# Patient Record
Sex: Male | Born: 1950 | Race: White | Hispanic: No | Smoking: Never smoker
Health system: Southern US, Community
[De-identification: ages and names within clinical notes are randomized; demographics above are authoritative.]

## PROBLEM LIST (undated history)

## (undated) DIAGNOSIS — R0600 Dyspnea, unspecified: Secondary | ICD-10-CM

## (undated) DIAGNOSIS — F329 Major depressive disorder, single episode, unspecified: Secondary | ICD-10-CM

## (undated) DIAGNOSIS — F32A Depression, unspecified: Secondary | ICD-10-CM

## (undated) DIAGNOSIS — I639 Cerebral infarction, unspecified: Secondary | ICD-10-CM

## (undated) DIAGNOSIS — F4310 Post-traumatic stress disorder, unspecified: Secondary | ICD-10-CM

## (undated) DIAGNOSIS — F410 Panic disorder [episodic paroxysmal anxiety] without agoraphobia: Secondary | ICD-10-CM

## (undated) DIAGNOSIS — F419 Anxiety disorder, unspecified: Secondary | ICD-10-CM

## (undated) DIAGNOSIS — S069X9A Unspecified intracranial injury with loss of consciousness of unspecified duration, initial encounter: Secondary | ICD-10-CM

## (undated) DIAGNOSIS — M1990 Unspecified osteoarthritis, unspecified site: Secondary | ICD-10-CM

## (undated) DIAGNOSIS — I10 Essential (primary) hypertension: Secondary | ICD-10-CM

## (undated) DIAGNOSIS — I82409 Acute embolism and thrombosis of unspecified deep veins of unspecified lower extremity: Secondary | ICD-10-CM

## (undated) DIAGNOSIS — K219 Gastro-esophageal reflux disease without esophagitis: Secondary | ICD-10-CM

## (undated) DIAGNOSIS — Z87442 Personal history of urinary calculi: Secondary | ICD-10-CM

## (undated) HISTORY — PX: APPENDECTOMY: SHX54

## (undated) HISTORY — PX: MANDIBLE FRACTURE SURGERY: SHX706

## (undated) HISTORY — PX: CYSTOSCOPY: SUR368

## (undated) HISTORY — DX: Panic disorder (episodic paroxysmal anxiety): F41.0

## (undated) HISTORY — PX: ANKLE SURGERY: SHX546

## (undated) HISTORY — DX: Cerebral infarction, unspecified: I63.9

## (undated) HISTORY — PX: SHOULDER ARTHROSCOPY: SHX128

## (undated) HISTORY — PX: KNEE ARTHROSCOPY: SUR90

## (undated) HISTORY — PX: COLON RESECTION: SHX5231

## (undated) HISTORY — DX: Essential (primary) hypertension: I10

---

## 2002-05-07 ENCOUNTER — Emergency Department (HOSPITAL_COMMUNITY): Admission: EM | Admit: 2002-05-07 | Discharge: 2002-05-07 | Payer: Self-pay | Admitting: *Deleted

## 2002-07-25 ENCOUNTER — Encounter: Payer: Self-pay | Admitting: Family Medicine

## 2002-07-25 ENCOUNTER — Ambulatory Visit (HOSPITAL_COMMUNITY): Admission: RE | Admit: 2002-07-25 | Discharge: 2002-07-25 | Payer: Self-pay | Admitting: Family Medicine

## 2003-09-27 ENCOUNTER — Ambulatory Visit (HOSPITAL_COMMUNITY): Admission: RE | Admit: 2003-09-27 | Discharge: 2003-09-27 | Payer: Self-pay | Admitting: Family Medicine

## 2003-10-31 ENCOUNTER — Encounter: Admission: RE | Admit: 2003-10-31 | Discharge: 2003-10-31 | Payer: Self-pay | Admitting: Neurosurgery

## 2003-11-13 ENCOUNTER — Encounter: Admission: RE | Admit: 2003-11-13 | Discharge: 2003-11-13 | Payer: Self-pay | Admitting: Neurosurgery

## 2003-11-27 ENCOUNTER — Encounter: Admission: RE | Admit: 2003-11-27 | Discharge: 2003-11-27 | Payer: Self-pay | Admitting: Neurosurgery

## 2004-04-09 ENCOUNTER — Ambulatory Visit (HOSPITAL_COMMUNITY): Admission: RE | Admit: 2004-04-09 | Discharge: 2004-04-09 | Payer: Self-pay | Admitting: Family Medicine

## 2004-11-30 ENCOUNTER — Emergency Department (HOSPITAL_COMMUNITY): Admission: EM | Admit: 2004-11-30 | Discharge: 2004-11-30 | Payer: Self-pay | Admitting: Emergency Medicine

## 2005-02-11 ENCOUNTER — Emergency Department (HOSPITAL_COMMUNITY): Admission: EM | Admit: 2005-02-11 | Discharge: 2005-02-11 | Payer: Self-pay | Admitting: Emergency Medicine

## 2005-07-28 ENCOUNTER — Encounter: Admission: RE | Admit: 2005-07-28 | Discharge: 2005-07-28 | Payer: Self-pay | Admitting: Sports Medicine

## 2005-10-09 ENCOUNTER — Encounter (HOSPITAL_COMMUNITY): Admission: RE | Admit: 2005-10-09 | Discharge: 2005-10-30 | Payer: Self-pay | Admitting: Orthopedic Surgery

## 2005-11-03 HISTORY — PX: CERVICAL DISC SURGERY: SHX588

## 2005-11-03 HISTORY — PX: COLONOSCOPY: SHX174

## 2005-11-04 ENCOUNTER — Encounter: Admission: RE | Admit: 2005-11-04 | Discharge: 2005-11-04 | Payer: Self-pay | Admitting: Orthopedic Surgery

## 2006-01-07 ENCOUNTER — Ambulatory Visit (HOSPITAL_COMMUNITY): Admission: RE | Admit: 2006-01-07 | Discharge: 2006-01-07 | Payer: Self-pay | Admitting: Internal Medicine

## 2006-01-07 ENCOUNTER — Ambulatory Visit: Payer: Self-pay | Admitting: Internal Medicine

## 2006-07-14 ENCOUNTER — Ambulatory Visit (HOSPITAL_COMMUNITY): Admission: RE | Admit: 2006-07-14 | Discharge: 2006-07-15 | Payer: Self-pay | Admitting: Neurosurgery

## 2008-01-13 ENCOUNTER — Encounter: Admission: RE | Admit: 2008-01-13 | Discharge: 2008-01-13 | Payer: Self-pay | Admitting: Neurosurgery

## 2008-06-22 ENCOUNTER — Ambulatory Visit (HOSPITAL_COMMUNITY): Admission: RE | Admit: 2008-06-22 | Discharge: 2008-06-22 | Payer: Self-pay | Admitting: Family Medicine

## 2010-11-23 ENCOUNTER — Encounter: Payer: Self-pay | Admitting: Neurosurgery

## 2011-03-21 NOTE — Op Note (Signed)
Wesley Gregory, RANSOME                 ACCOUNT NO.:  [id number]   MEDICAL RECORD NO.:  [id number]          PATIENT TYPE:  AMB   LOCATION:  DAY                           FACILITY:  [facility]   PHYSICIAN:  R. Roetta Sessions, M.D. DATE OF BIRTH:  June 11, 1951   DATE OF PROCEDURE:  01/07/2006  DATE OF DISCHARGE:                                 OPERATIVE REPORT   PROCEDURE:  Screening colonoscopy.   INDICATIONS FOR PROCEDURE:  The patient is a 60 year old gentleman with no  GI symptoms referred at the courtesy of Dr. [name] for colorectal  cancer screening. He has never had his lower GI tract imaged. There is no  family history of colorectal neoplasia. Colonoscopy is now being done as a  screening maneuver. This approach has been discussed with the patient at  length. Potential risks, benefits, and alternatives have been reviewed and  questions answered. He is agreeable. Please see documentation in the medical  record.   PROCEDURE NOTE:  O2 saturation, blood pressure, pulse, and respirations were  monitored throughout the entire procedure. Conscious sedation with Versed 3  mg IV and Demerol 75 mg IV in divided doses.   INSTRUMENT:  Olympus video chip system.   FINDINGS:  Digital rectal exam revealed no abnormalities.   ENDOSCOPIC FINDINGS:  Prep was excellent.   Rectum:  Examination of the rectal mucosa including retroflexed view of the  anal verge revealed no abnormalities.   Colon:  Colonic mucosa was surveyed from the rectosigmoid junction through  the left, transverse, and right colon to the area of the appendiceal  orifice, ileocecal valve, and cecum. These structures were well seen and  photographed for the record. From this level, the scope was slowly and  cautiously withdrawn, and all previously mentioned mucosal surfaces were  again seen. Colonic mucosa appeared normal. The patient tolerated the  procedure well and was reactive to endoscopy.   IMPRESSION:  1.  Normal  rectum.  2.  Normal colon.   RECOMMENDATIONS:  Repeat screening colonoscopy in 10 years.      Jonathon Bellows, M.D.  Electronically Signed     RMR/MEDQ  D:  01/07/2006  T:  01/08/2006  Job:  [id number]   cc:   [name], M.D.  Fax: [phone]

## 2011-03-21 NOTE — H&P (Signed)
NAMESIRIUS, WOODFORD NO.:  [id number]   MEDICAL RECORD NO.:  [id number]          PATIENT TYPE:  AMB   LOCATION:  SDS                          FACILITY:  [facility]   PHYSICIAN:  Payton Doughty, M.D.      DATE OF BIRTH:  July 31, 1951   DATE OF ADMISSION:  07/14/2006  DATE OF DISCHARGE:                                HISTORY & PHYSICAL   ADMISSION DIAGNOSIS:  Herniated disc C6-C7 on the right.   HISTORY OF PRESENT ILLNESS:  This is a very nice, 60 year old, right-handed,  white gentleman who has been seen for a couple of years for pain in his back  down his leg.  He had some pain in his neck down his right shoulder and arm  and had weak right triceps.  MRI was obtained that showed a herniated disc  at C6-C7 on the right and he is now admitted for an anterior decompression  and fusion.   PAST MEDICAL HISTORY:  Unremarkable.   MEDICATIONS:  Prozac for panic attacks.   ALLERGIES:  CORTISONE.   PAST SURGICAL HISTORY:  1. Rotator cuff a few years ago by Dr. Chaney Malling.  2. Appendectomy a number of years ago.   SOCIAL HISTORY:  He does not smoke or drink.  He is a Consulting civil engineer.   FAMILY HISTORY:  Both parents are 40.  Mom has diabetes.  Dad has  Parkinson's disease.  Father has COPD and heart disease.   REVIEW OF SYSTEMS:  Remarkable for occasional glasses, broken bones in his  ribs, ankles, shoulders, coccyx and legs, back pain, leg pain, joint pain,  arthritis and neck pain.   PHYSICAL EXAMINATION:  HEENT:  Normal.  NECK:  He has reasonable range of motion in his neck.  CHEST:  Clear.  CARDIAC:  Regular rate and rhythm.  ABDOMEN:  Nontender with no hepatosplenomegaly.  EXTREMITIES:  Without clubbing or cyanosis.  Peripheral pulses are good.  GENITALIA:  Deferred.  NEUROLOGIC:  He is awake, alert and oriented.  Cranial nerves 2-12 grossly  intact.  Motor exam shows 5/5 strength throughout the upper and lower  extremities except for right triceps at  4/5.  He has a right C7 sensory  deficit and reflexes are 1 at the biceps, absent at the right triceps, 1 at  the left, 1 at the brachioradialis.  Hoffmann is negative.  Toes are  downgoing bilaterally.   LABORATORY DATA AND X-RAY FINDINGS:  MRI demonstrates a herniated disc at C6-  C7 eccentric to the right.  He also has spondylitic disease at C3-C4, C4-C5  and C5-C6, but clearly the pathology is at C6-C7.   IMPRESSION:  Right C7 radiculopathy related to herniated disc at C6-C7.   PLAN:  The plan is for an anterior decompression fusion at C6-C7.  The risks  and benefits of this approach have been discussed with him and he wishes to  proceed.           ______________________________  Payton Doughty, M.D.     MWR/MEDQ  D:  07/14/2006  T:  07/14/2006  Job:  [id number]

## 2011-03-21 NOTE — Op Note (Signed)
NAMEBRANDN, MCGATH NO.:  [id number]   MEDICAL RECORD NO.:  [id number]          PATIENT TYPE:  AMB   LOCATION:  SDS                          FACILITY:  [facility]   PHYSICIAN:  Payton Doughty, M.D.      DATE OF BIRTH:  December 30, 1950   DATE OF PROCEDURE:  07/14/2006  DATE OF DISCHARGE:                                 OPERATIVE REPORT   PREOPERATIVE DIAGNOSIS:  Herniated disk, C6-7, right.   POSTOPERATIVE DIAGNOSIS:  Herniated disk, C6-7, right.   OPERATION PERFORMED:  C6-7 anterior cervical decompression and fusion with a  Reflex Hybrid plate.   SURGEON:  Payton Doughty, M.D.   DOCTOR ASSISTANT:  Coletta Memos, M.D.   NURSE ASSISTANT:  [place].   SERVICE:  Neurosurgery.   ANESTHESIA:  General endotracheal.   PREPARATION:  Prep and scrub with alcohol wipe.   COMPLICATIONS:  None.   BODY OF TEXT:  A 60 year old, right-handed, white gentleman with a right C7  radiculopathy, taken to the operating room, smoothly anesthetized,  intubated, placed supine on the operating table in the halter head traction  with the neck extended.  Following shave, prep and drape in the usual  sterile fashion, the skin was incised in the midline over the medial border  of the sternocleidomastoid muscle 2 fingerbreadths below the level of the  carotid tubercle.  The platysma was identified, elevated, and undermined.  The sternocleidomastoid was identified.  Medial dissection revealed the  carotid artery, retracted laterally to the left.  The trachea and esophagus  retracted laterally to the right, exposing the bones of the anterior  cervical spine.  A marker was placed and intraoperative x-ray obtained to  confirm correctness of the  level,  Having confirmed the correctness of the  level, diskectomy was carried out under gross observation, and then we  brought in the operating microscope and used microdissection technique to  remove the remaining disk, divide the posterior longitudinal  ligament and  explore the neural foramina bilaterally.  On the right side, there was a  fragment of herniated disk extending out into the right C7 neural foramen  with compression of the right C7 root.  Following removal of this and  complete decompression, the neural foramina were explored and found to be  open.  The wound was irrigated and hemostasis assured.  An 8-mm bone graft  was fashioned of patellar allograft and tapped into place.  A 14-mm Reflex  Hybrid plate was then placed with two 12-mm screws in C6 and two in C7.  Intraoperative x-rays show good placement of bone graft, plate and screws.  The  platysma and subcutaneous tissues were reapproximated with 3-0 Vicryl in  interrupted fashion.  The skin was closed with 4-0 Vicryl in running,  subcuticular fashion.  Benzoin and Steri-Strips were placed and made  occlusive with Telfa and OpSite, and the patient returned to the recovery  room in good condition.           ______________________________  Payton Doughty, M.D.     MWR/MEDQ  D:  07/14/2006  T:  07/14/2006  Job:  [id number]

## 2016-09-10 ENCOUNTER — Encounter (INDEPENDENT_AMBULATORY_CARE_PROVIDER_SITE_OTHER): Payer: Self-pay | Admitting: Orthopedic Surgery

## 2016-09-10 ENCOUNTER — Ambulatory Visit (INDEPENDENT_AMBULATORY_CARE_PROVIDER_SITE_OTHER): Payer: No Typology Code available for payment source | Admitting: Orthopedic Surgery

## 2016-09-10 ENCOUNTER — Other Ambulatory Visit (INDEPENDENT_AMBULATORY_CARE_PROVIDER_SITE_OTHER): Payer: Self-pay | Admitting: Orthopedic Surgery

## 2016-09-10 VITALS — BP 158/104 | HR 76 | Ht 77.0 in | Wt 201.0 lb

## 2016-09-10 DIAGNOSIS — M5441 Lumbago with sciatica, right side: Secondary | ICD-10-CM | POA: Diagnosis not present

## 2016-09-10 DIAGNOSIS — M75121 Complete rotator cuff tear or rupture of right shoulder, not specified as traumatic: Secondary | ICD-10-CM | POA: Diagnosis not present

## 2016-09-10 DIAGNOSIS — G8929 Other chronic pain: Secondary | ICD-10-CM

## 2016-09-10 DIAGNOSIS — M5442 Lumbago with sciatica, left side: Secondary | ICD-10-CM | POA: Diagnosis not present

## 2016-09-10 NOTE — Progress Notes (Signed)
Office Visit Note   Patient: Wesley Gregory           Date of Birth: May 10, 1951           MRN: [id number] Visit Date: 09/10/2016 Requested by: No referring provider defined for this encounter. PCP: No primary care provider on file.  Subjective: Chief Complaint  Patient presents with  . Right Shoulder - Pain    HPI Wesley Gregory is a 65 year old patient with known right shoulder rotator cuff tear infraspinatus and supraspinatus.  Retracted to about the level of the top of the humeral head back in June.  Been having continued weakness and pain in the right shoulder.  He also reports very significant back pain as result of the fall.  Plain radiographs normal by report at that time that he describes numbness and tingling going down both legs as well as significant inability to get on and off a horse.  He has been continuing to work but his back pain is become severe.  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              Review of Systems All systems reviewed are negative as they relate to the chief complaint within the history of present illness.  Patient denies  fevers or chills.    Assessment & Plan: Visit Diagnoses:  1. Complete tear of right rotator cuff     Plan: Impression is right shoulder rotator cuff tear.  This is primarily a posterior superior cuff tear with retraction.  I think that it is possible we may be able to get a watertight repair but even if we can do think we should be able to restore at least some of the anterior posterior force couple to improve the strength in his shoulder.  We'll set that up as soon as possible for the right shoulder.  In regards to the back of think he needs an MRI scan to look for worsening of spinal stenosis.  Compression fracture would be a concern but that did not show at the time of the injury however his symptoms have persisted and worsened.   Doesn't have much in terms of weakness but I think it's worth looking at a scan on his back.  Follow-Up Instructions: No Follow-up on file.   Orders:  No orders of the defined types were placed in this encounter.  No orders of the defined types were placed in this encounter.     Procedures: No procedures performed   Clinical Data: No additional findings.  Objective: Vital Signs: BP (!) 158/104   Pulse 76   Ht 6\' 5"  (1.956 m)   Wt 201 lb (91.2 kg)   BMI 23.84 kg/m   Physical Exam  Constitutional: He appears well-developed.  HENT:  Head: Normocephalic.  Eyes: EOM are normal.  Neck: Normal range of motion.  Cardiovascular: Normal rate.   Pulmonary/Chest: Effort normal.  Neurological: He is alert.  Skin: Skin is warm.  Psychiatric: He has a normal mood and affect.    Ortho Exam examination the right shoulder demonstrates well-healed surgical scar in a strap pattern.  He's got coarseness with internal/external rotation at 90 of abduction.  Weakness to supraspinatus and infraspinatus testing on the right which is not present on the left.  Subscap testing is intact.  He does have improved active forward flexion and abduction to just around 90  but he does have fairly profound external rotation weakness.  On his back he does have equivocal nerve retention signs but good ankle dorsi and plantar flexion: Hamstring strength.  A lot of pain with forward lateral bending.  Some paresthesias in the back of the of the thighs bilaterally.  Both feet are perfused.    Specialty Comments:  No specialty comments available.  Imaging: No results found.   PMFS History: There are no active problems to display for this patient.  No past medical history on file.  No family history on file.  No past surgical history on file. Social History   Occupational History  . Not on file.   Social History Main Topics  . Smoking status: Never Smoker  . Smokeless tobacco: Never Used  . Alcohol  use No  . Drug use: No  . Sexual activity: Not on file

## 2016-09-14 ENCOUNTER — Ambulatory Visit
Admission: RE | Admit: 2016-09-14 | Discharge: 2016-09-14 | Disposition: A | Discharge status: Home / Self Care | Payer: No Typology Code available for payment source | Source: Ambulatory Visit | Attending: Orthopedic Surgery | Admitting: Orthopedic Surgery

## 2016-09-14 DIAGNOSIS — G8929 Other chronic pain: Secondary | ICD-10-CM

## 2016-09-14 DIAGNOSIS — M5442 Lumbago with sciatica, left side: Principal | ICD-10-CM

## 2016-09-14 DIAGNOSIS — M5441 Lumbago with sciatica, right side: Principal | ICD-10-CM

## 2016-09-15 ENCOUNTER — Encounter (HOSPITAL_COMMUNITY)
Admission: RE | Admit: 2016-09-15 | Discharge: 2016-09-15 | Disposition: A | Discharge status: Home / Self Care | Payer: No Typology Code available for payment source | Source: Ambulatory Visit | Attending: Orthopedic Surgery | Admitting: Orthopedic Surgery

## 2016-09-15 ENCOUNTER — Encounter (HOSPITAL_COMMUNITY): Payer: Self-pay

## 2016-09-15 DIAGNOSIS — Z79899 Other long term (current) drug therapy: Secondary | ICD-10-CM | POA: Diagnosis not present

## 2016-09-15 DIAGNOSIS — S46011A Strain of muscle(s) and tendon(s) of the rotator cuff of right shoulder, initial encounter: Secondary | ICD-10-CM | POA: Diagnosis present

## 2016-09-15 DIAGNOSIS — Z7982 Long term (current) use of aspirin: Secondary | ICD-10-CM | POA: Diagnosis not present

## 2016-09-15 DIAGNOSIS — Z791 Long term (current) use of non-steroidal anti-inflammatories (NSAID): Secondary | ICD-10-CM | POA: Diagnosis not present

## 2016-09-15 DIAGNOSIS — Z86718 Personal history of other venous thrombosis and embolism: Secondary | ICD-10-CM | POA: Diagnosis not present

## 2016-09-15 DIAGNOSIS — K219 Gastro-esophageal reflux disease without esophagitis: Secondary | ICD-10-CM | POA: Diagnosis not present

## 2016-09-15 HISTORY — DX: Personal history of urinary calculi: Z87.442

## 2016-09-15 HISTORY — DX: Acute embolism and thrombosis of unspecified deep veins of unspecified lower extremity: I82.409

## 2016-09-15 HISTORY — DX: Post-traumatic stress disorder, unspecified: F43.10

## 2016-09-15 HISTORY — DX: Dyspnea, unspecified: R06.00

## 2016-09-15 HISTORY — DX: Depression, unspecified: F32.A

## 2016-09-15 HISTORY — DX: Unspecified intracranial injury with loss of consciousness of unspecified duration, initial encounter: S06.9X9A

## 2016-09-15 HISTORY — DX: Gastro-esophageal reflux disease without esophagitis: K21.9

## 2016-09-15 HISTORY — DX: Unspecified osteoarthritis, unspecified site: M19.90

## 2016-09-15 HISTORY — DX: Anxiety disorder, unspecified: F41.9

## 2016-09-15 HISTORY — DX: Major depressive disorder, single episode, unspecified: F32.9

## 2016-09-15 LAB — CBC
HEMATOCRIT: 45.5 % (ref 39.0–52.0)
HEMOGLOBIN: 16.2 g/dL (ref 13.0–17.0)
MCH: 31 pg (ref 26.0–34.0)
MCHC: 35.6 g/dL (ref 30.0–36.0)
MCV: 87 fL (ref 78.0–100.0)
Platelets: 222 10*3/uL (ref 150–400)
RBC: 5.23 MIL/uL (ref 4.22–5.81)
RDW: 12.6 % (ref 11.5–15.5)
WBC: 6.6 10*3/uL (ref 4.0–10.5)

## 2016-09-15 MED ORDER — CEFAZOLIN SODIUM-DEXTROSE 2-4 GM/100ML-% IV SOLN
2.0000 g | INTRAVENOUS | Status: AC
  Administered 2016-09-16: 2 g via INTRAVENOUS
  Filled 2016-09-15: qty 100

## 2016-09-15 NOTE — H&P (Addendum)
Wesley Gregory is an 65 y.o. male.   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  MRI scan showed significant rotator cuff tear with retraction to the top of the humeral head.  This was several months ago.  He had to finish his job and now presents for further management of his rotator cuff problem.  He describes significant weakness in the right shoulder but he has managed to carry on due to willpower.  Past Medical History:  Diagnosis Date  . Anxiety   . Arthritis   . Depression   . DVT (deep venous thrombosis) ([place])   . Dyspnea    with injury  . GERD (gastroesophageal reflux disease)   . Head injury with loss of consciousness ([place])    short loss of consciouness, numerous head injuries  . History of kidney stones    Passed a couple  . PTSD (post-traumatic stress disorder)     Past Surgical History:  Procedure Laterality Date  . ANKLE SURGERY Left    fracture repair- pins  . CERVIX SURGERY  2009  . COLON RESECTION  1980's   abdominal injury  . COLONOSCOPY     x 2  . CYSTOSCOPY    . KNEE ARTHROSCOPY Bilateral   . MANDIBLE FRACTURE SURGERY    . SHOULDER ARTHROSCOPY     x 3 rotatoar cuff    No family history on file. Social History:  reports that he has never smoked. He has never used smokeless tobacco. He reports that he does not drink alcohol or use drugs.  Allergies:  Allergies  Allergen Reactions  . Sulfa Antibiotics Other (See Comments)    Blisters on tongue    No prescriptions prior to admission.    Results for orders placed or performed during the hospital encounter of 09/15/16 (from the past 48 hour(s))  CBC     Status: None   Collection Time: 09/15/16 10:36 AM  Result Value Ref Range   WBC 6.6 4.0 - 10.5 K/uL   RBC 5.23 4.22 - 5.81 MIL/uL   Hemoglobin 16.2 13.0 - 17.0 g/dL   HCT 45.5 39.0 - 52.0 %   MCV 87.0 78.0 - 100.0 fL   MCH 31.0 26.0 - 34.0 pg    MCHC 35.6 30.0 - 36.0 g/dL   RDW 12.6 11.5 - 15.5 %   Platelets 222 150 - 400 K/uL   Mr Lumbar Spine W/o Contrast  Result Date: 09/14/2016 CLINICAL DATA:  Low back pain with weakness, pain and numbness in both legs. Thrown from horse on 02/17/2016 EXAM: MRI LUMBAR SPINE WITHOUT CONTRAST TECHNIQUE: Multiplanar, multisequence MR imaging of the lumbar spine was performed. No intravenous contrast was administered. COMPARISON:  None. FINDINGS: Segmentation:  5 lumbar type vertebral bodies. Alignment:  Normal Vertebrae: No fracture. No primary bone lesion. Few scattered benign hemangiomas. Conus medullaris: Extends to the L1 level and appears normal. Paraspinal and other soft tissues: Normal Disc levels: T11-12: Normal. T12-L1: Chronic appearing broad-based disc herniation indents the thecal sac but does not appear to cause neural compression. No foraminal extension. L1-2: Desiccation and bulging of the disc. Slight indentation of the thecal sac. No compressive stenosis. L2-3: Desiccation and bulging of the disc. Facet and ligamentous hypertrophy. Mild stenosis of both lateral recesses but without visible neural compression. L3-4: Circumferential bulging of the disc. Pronounced facet and ligamentous hypertrophy. Moderate to severe multifactorial stenosis at  this level that could cause neural compression in either or both lateral recesses. L4-5: Shallow broad-based disc herniation. Bilateral facet and ligamentous hypertrophy. Moderate stenosis of the central canal. Severe stenosis of both lateral recesses that could cause neural compression on either or both sides. L5-S1: Bulging of the disc. Bilateral facet arthropathy with a small synovial cyst projecting inward from the facet on the left. Narrowing of the subarticular lateral recess on the left that could affect the left S1 nerve root. IMPRESSION: No evidence of regional fracture. Degenerative disc disease and degenerative facet disease from T12-L1 through L5-S1.  Multifactorial spinal stenosis at L3-4 could cause neural compression on either or both sides. Multifactorial spinal stenosis at L4-5 could cause neural compression on either or both sides. Subarticular lateral recess narrowing on the left at L5-S1 because of bulging of the disc, facet hypertrophy and a small synovial cyst could affect the left S1 nerve root. Electronically Signed   By: Wesley Gregory M.D.   On: 09/14/2016 09:17    Review of Systems  Musculoskeletal: Positive for joint pain.  All other systems reviewed and are negative.   There were no vitals taken for this visit. Physical Exam  Constitutional: He appears well-developed.  HENT:  Head: Normocephalic.  Eyes: Pupils are equal, round, and reactive to light.  Neck: Normal range of motion.  Cardiovascular: Normal rate.   Respiratory: Effort normal.  Neurological: He is alert.  Skin: Skin is warm.  Psychiatric: He has a normal mood and affect.   Examination of the right shoulder demonstrates weakness to supraspinatus and infraspinatus testing.  Subscap strength is intact.  He does not have active forward flexion and abduction beyond 90.  No acromioclavicular joint tenderness is present.  Course grinding and crepitus is present with active and passive range of motion of the right shoulder.  He has a little bit of coarseness on the left as well but does have full active and passive range of motion on that side.  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  This is been a significant disability for him.  I think there is a reasonable likelihood that we will be able to affect a rotator cuff tear repair in this patient.  It may not be watertight but I think it's possible that we could reestablish the anterior  posterior force couple in his shoulder.  At the time of the MRI scan even though there was significant retraction there was no muscle atrophy indicating the acute nature of the injury.  It has however been several months since the injury and thus if this is retracted more fully and may be more difficult to mobilize.  We may also have to do a biceps tenodesis at the time.  Patient understands the risks and benefits all questions answered  Anderson Malta, MD 09/15/2016, 7:29 PM

## 2016-09-15 NOTE — Progress Notes (Signed)
Wesley Gregory denies chest pain or shortness of breath at rest. PCP is at [place] in [place], seen last in June of this year, records requested.

## 2016-09-15 NOTE — Pre-Procedure Instructions (Addendum)
    EASHAN GOEDERT  09/15/2016     Your procedure is scheduled on November 14.  Report to [facility] Admitting at 12:40 PM                 Your surgery or procedure is scheduled for 2:40pm   Call this number if you have problems the morning of [phone]                  Remember:  Do not eat food or drink liquids after midnight.  Take these medicines the morning of surgery with A SIP OF WATER: omeprazole (PRILOSEC).  Stop taking Aspirin, Aspirin Products and herbal medications.  Do not take any NSAIDS ie:  Ibuprofen, Advil, Naproxen.    Do not wear jewelry, make-up or nail polish.  Do not wear lotions, powders, or perfumes, or deodorant.  Men may shave face and neck.  Do not bring valuables to the hospital.  [facility] is not responsible for any belongings or valuables.  Contacts, dentures or bridgework may not be worn into surgery.  Leave your suitcase in the car.  After surgery it may be brought to your room.  For patients admitted to the hospital, discharge time will be determined by your treatment team.  Patients discharged the day of surgery will not be allowed to drive home.   Special instructions: Review  [place] - Preparing For Surgery.  Please read over the following fact sheets that you were given. [place]- Preparing For Surgery and Patient Instructions for Mupirocin Application, Incentive Spirometry, Pain Booklet

## 2016-09-16 ENCOUNTER — Ambulatory Visit (HOSPITAL_COMMUNITY): Payer: No Typology Code available for payment source | Admitting: Anesthesiology

## 2016-09-16 ENCOUNTER — Encounter (HOSPITAL_COMMUNITY)
Admission: RE | Disposition: A | Discharge status: Home / Self Care | Payer: Self-pay | Source: Ambulatory Visit | Attending: Orthopedic Surgery

## 2016-09-16 ENCOUNTER — Telehealth (HOSPITAL_BASED_OUTPATIENT_CLINIC_OR_DEPARTMENT_OTHER): Payer: Self-pay | Admitting: *Deleted

## 2016-09-16 ENCOUNTER — Ambulatory Visit (HOSPITAL_COMMUNITY)
Admission: RE | Admit: 2016-09-16 | Discharge: 2016-09-16 | Disposition: A | Discharge status: Home / Self Care | Payer: No Typology Code available for payment source | Source: Ambulatory Visit | Attending: Orthopedic Surgery | Admitting: Orthopedic Surgery

## 2016-09-16 ENCOUNTER — Encounter (HOSPITAL_COMMUNITY): Payer: Self-pay | Admitting: *Deleted

## 2016-09-16 DIAGNOSIS — Z79899 Other long term (current) drug therapy: Secondary | ICD-10-CM | POA: Insufficient documentation

## 2016-09-16 DIAGNOSIS — Z7982 Long term (current) use of aspirin: Secondary | ICD-10-CM | POA: Insufficient documentation

## 2016-09-16 DIAGNOSIS — M75121 Complete rotator cuff tear or rupture of right shoulder, not specified as traumatic: Secondary | ICD-10-CM

## 2016-09-16 DIAGNOSIS — M7521 Bicipital tendinitis, right shoulder: Secondary | ICD-10-CM | POA: Diagnosis not present

## 2016-09-16 DIAGNOSIS — Z791 Long term (current) use of non-steroidal anti-inflammatories (NSAID): Secondary | ICD-10-CM | POA: Insufficient documentation

## 2016-09-16 DIAGNOSIS — S46011A Strain of muscle(s) and tendon(s) of the rotator cuff of right shoulder, initial encounter: Secondary | ICD-10-CM | POA: Insufficient documentation

## 2016-09-16 DIAGNOSIS — Z86718 Personal history of other venous thrombosis and embolism: Secondary | ICD-10-CM | POA: Insufficient documentation

## 2016-09-16 DIAGNOSIS — K219 Gastro-esophageal reflux disease without esophagitis: Secondary | ICD-10-CM | POA: Diagnosis not present

## 2016-09-16 HISTORY — PX: SHOULDER ARTHROSCOPY WITH ROTATOR CUFF REPAIR AND OPEN BICEPS TENODESIS: SHX6677

## 2016-09-16 SURGERY — SHOULDER ARTHROSCOPY WITH ROTATOR CUFF REPAIR AND OPEN BICEPS TENODESIS
Anesthesia: General | Site: Shoulder | Laterality: Right

## 2016-09-16 MED ORDER — PHENYLEPHRINE HCL 10 MG/ML IJ SOLN
INTRAMUSCULAR | Status: DC | PRN
  Administered 2016-09-16 (×2): 40 ug via INTRAVENOUS
  Administered 2016-09-16: 80 ug via INTRAVENOUS

## 2016-09-16 MED ORDER — ARTIFICIAL TEARS OP OINT
TOPICAL_OINTMENT | OPHTHALMIC | Status: DC | PRN
  Administered 2016-09-16: 1 via OPHTHALMIC

## 2016-09-16 MED ORDER — MIDAZOLAM HCL 2 MG/2ML IJ SOLN
INTRAMUSCULAR | Status: AC
  Filled 2016-09-16: qty 2

## 2016-09-16 MED ORDER — ONDANSETRON HCL 4 MG/2ML IJ SOLN
INTRAMUSCULAR | Status: DC | PRN
  Administered 2016-09-16: 4 mg via INTRAVENOUS

## 2016-09-16 MED ORDER — CHLORHEXIDINE GLUCONATE 4 % EX LIQD: 60.0000 mL | Freq: Once | CUTANEOUS | Status: DC

## 2016-09-16 MED ORDER — LIDOCAINE HCL (CARDIAC) 20 MG/ML IV SOLN
INTRAVENOUS | Status: DC | PRN
  Administered 2016-09-16: 80 mg via INTRAVENOUS

## 2016-09-16 MED ORDER — LACTATED RINGERS IV SOLN: Freq: Once | INTRAVENOUS | Status: DC

## 2016-09-16 MED ORDER — FENTANYL CITRATE (PF) 100 MCG/2ML IJ SOLN
INTRAMUSCULAR | Status: AC
  Administered 2016-09-16: 100 ug
  Filled 2016-09-16: qty 2

## 2016-09-16 MED ORDER — EPHEDRINE SULFATE 50 MG/ML IJ SOLN
INTRAMUSCULAR | Status: DC | PRN
Start: 2016-09-16 — End: 2016-09-16
  Administered 2016-09-16: 10 mg via INTRAVENOUS
  Administered 2016-09-16: 5 mg via INTRAVENOUS
  Administered 2016-09-16: 10 mg via INTRAVENOUS

## 2016-09-16 MED ORDER — MIDAZOLAM HCL 5 MG/5ML IJ SOLN
INTRAMUSCULAR | Status: DC | PRN
  Administered 2016-09-16: 2 mg via INTRAVENOUS

## 2016-09-16 MED ORDER — PHENYLEPHRINE HCL 10 MG/ML IJ SOLN
INTRAVENOUS | Status: DC | PRN
  Administered 2016-09-16: 5 ug/min via INTRAVENOUS

## 2016-09-16 MED ORDER — PROPOFOL 10 MG/ML IV BOLUS
INTRAVENOUS | Status: DC | PRN
  Administered 2016-09-16: 200 mg via INTRAVENOUS

## 2016-09-16 MED ORDER — OXYCODONE HCL 5 MG PO CAPS: 5.0000 mg | ORAL_CAPSULE | ORAL | 0 refills | Status: DC | PRN

## 2016-09-16 MED ORDER — PROPOFOL 10 MG/ML IV BOLUS
INTRAVENOUS | Status: AC
  Filled 2016-09-16: qty 40

## 2016-09-16 MED ORDER — LACTATED RINGERS IV SOLN
INTRAVENOUS | Status: DC | PRN
  Administered 2016-09-16 (×2): via INTRAVENOUS

## 2016-09-16 MED ORDER — FENTANYL CITRATE (PF) 100 MCG/2ML IJ SOLN: 100.0000 ug | Freq: Once | INTRAMUSCULAR | Status: DC

## 2016-09-16 MED ORDER — FENTANYL CITRATE (PF) 100 MCG/2ML IJ SOLN
INTRAMUSCULAR | Status: AC
  Filled 2016-09-16: qty 2

## 2016-09-16 MED ORDER — METHOCARBAMOL 500 MG PO TABS: 500.0000 mg | ORAL_TABLET | Freq: Three times a day (TID) | ORAL | 0 refills | Status: DC | PRN

## 2016-09-16 MED ORDER — FENTANYL CITRATE (PF) 100 MCG/2ML IJ SOLN
INTRAMUSCULAR | Status: DC | PRN
Start: 2016-09-16 — End: 2016-09-16
  Administered 2016-09-16 (×2): 50 ug via INTRAVENOUS

## 2016-09-16 MED ORDER — MIDAZOLAM HCL 2 MG/2ML IJ SOLN: 2.0000 mg | Freq: Once | INTRAMUSCULAR | Status: DC

## 2016-09-16 MED ORDER — EPINEPHRINE PF 1 MG/ML IJ SOLN
INTRAMUSCULAR | Status: DC | PRN
  Administered 2016-09-16: .1 mg

## 2016-09-16 MED ORDER — BUPIVACAINE-EPINEPHRINE (PF) 0.5% -1:200000 IJ SOLN
INTRAMUSCULAR | Status: DC | PRN
  Administered 2016-09-16: 15 mL via PERINEURAL

## 2016-09-16 MED ORDER — ROCURONIUM BROMIDE 100 MG/10ML IV SOLN
INTRAVENOUS | Status: DC | PRN
  Administered 2016-09-16: 50 mg via INTRAVENOUS

## 2016-09-16 MED ORDER — SUGAMMADEX SODIUM 200 MG/2ML IV SOLN
INTRAVENOUS | Status: DC | PRN
  Administered 2016-09-16: 200 mg via INTRAVENOUS

## 2016-09-16 MED ORDER — LACTATED RINGERS IV SOLN: INTRAVENOUS | Status: DC

## 2016-09-16 MED ORDER — SODIUM CHLORIDE 0.9 % IR SOLN
Status: DC | PRN
Start: 2016-09-16 — End: 2016-09-16
  Administered 2016-09-16: 3000 mL

## 2016-09-16 MED ORDER — SODIUM CHLORIDE 0.9 % IJ SOLN
INTRAMUSCULAR | Status: DC | PRN
  Administered 2016-09-16: 10 mL via INTRAVENOUS

## 2016-09-16 MED ORDER — PROMETHAZINE HCL 25 MG/ML IJ SOLN: 6.2500 mg | INTRAMUSCULAR | Status: DC | PRN

## 2016-09-16 MED ORDER — EPINEPHRINE PF 1 MG/ML IJ SOLN
INTRAMUSCULAR | Status: AC
  Filled 2016-09-16: qty 1

## 2016-09-16 MED ORDER — HYDROMORPHONE HCL 1 MG/ML IJ SOLN: 0.2500 mg | INTRAMUSCULAR | Status: DC | PRN

## 2016-09-16 MED ORDER — MIDAZOLAM HCL 2 MG/2ML IJ SOLN
INTRAMUSCULAR | Status: AC
  Administered 2016-09-16: 2 mg
  Filled 2016-09-16: qty 2

## 2016-09-16 MED ORDER — LACTATED RINGERS IV SOLN
INTRAVENOUS | Status: DC
  Administered 2016-09-16: 13:00:00 via INTRAVENOUS

## 2016-09-16 MED ORDER — MEPERIDINE HCL 25 MG/ML IJ SOLN: 6.2500 mg | INTRAMUSCULAR | Status: DC | PRN

## 2016-09-16 SURGICAL SUPPLY — 69 items
ANCHOR SUT BIOCOMP CORKSREW (Anchor) ×6 IMPLANT
ANCHOR SUT SWIVELLOK BIO (Anchor) ×2 IMPLANT
BENZOIN TINCTURE PRP APPL 2/3 (GAUZE/BANDAGES/DRESSINGS) ×2 IMPLANT
BLADE CUTTER GATOR 3.5 (BLADE) ×2 IMPLANT
BLADE GREAT WHITE 4.2 (BLADE) ×2 IMPLANT
BLADE SURG 11 STRL SS (BLADE) ×2 IMPLANT
BUR OVAL 6.0 (BURR) ×2 IMPLANT
COVER SURGICAL LIGHT HANDLE (MISCELLANEOUS) ×2 IMPLANT
DRAPE INCISE IOBAN 66X45 STRL (DRAPES) ×4 IMPLANT
DRAPE STERI 35X30 U-POUCH (DRAPES) ×2 IMPLANT
DRAPE U-SHAPE 47X51 STRL (DRAPES) ×4 IMPLANT
DRSG PAD ABDOMINAL 8X10 ST (GAUZE/BANDAGES/DRESSINGS) ×6 IMPLANT
DRSG TEGADERM 2-3/8X2-3/4 SM (GAUZE/BANDAGES/DRESSINGS) ×6 IMPLANT
DRSG TEGADERM 4X4.75 (GAUZE/BANDAGES/DRESSINGS) ×6 IMPLANT
DURAPREP 26ML APPLICATOR (WOUND CARE) ×2 IMPLANT
ELECT REM PT RETURN 9FT ADLT (ELECTROSURGICAL) ×2
ELECTRODE REM PT RTRN 9FT ADLT (ELECTROSURGICAL) ×1 IMPLANT
FILTER STRAW FLUID ASPIR (MISCELLANEOUS) ×2 IMPLANT
GAUZE SPONGE 4X4 12PLY STRL (GAUZE/BANDAGES/DRESSINGS) ×2 IMPLANT
GAUZE XEROFORM 1X8 LF (GAUZE/BANDAGES/DRESSINGS) ×2 IMPLANT
GAUZE XEROFORM 5X9 LF (GAUZE/BANDAGES/DRESSINGS) ×2 IMPLANT
GLOVE BIOGEL PI IND STRL 7.5 (GLOVE) ×1 IMPLANT
GLOVE BIOGEL PI IND STRL 8 (GLOVE) ×1 IMPLANT
GLOVE BIOGEL PI INDICATOR 7.5 (GLOVE) ×1
GLOVE BIOGEL PI INDICATOR 8 (GLOVE) ×1
GLOVE ECLIPSE 7.0 STRL STRAW (GLOVE) ×2 IMPLANT
GLOVE SURG ORTHO 8.0 STRL STRW (GLOVE) ×2 IMPLANT
GOWN STRL REUS W/ TWL LRG LVL3 (GOWN DISPOSABLE) IMPLANT
GOWN STRL REUS W/ TWL XL LVL3 (GOWN DISPOSABLE) ×2 IMPLANT
GOWN STRL REUS W/TWL LRG LVL3 (GOWN DISPOSABLE)
GOWN STRL REUS W/TWL XL LVL3 (GOWN DISPOSABLE) ×2
KIT BASIN OR (CUSTOM PROCEDURE TRAY) ×2 IMPLANT
KIT ROOM TURNOVER OR (KITS) ×2 IMPLANT
MANIFOLD NEPTUNE II (INSTRUMENTS) ×2 IMPLANT
NDL SUT 6 .5 CRC .975X.05 MAYO (NEEDLE) ×1 IMPLANT
NEEDLE HYPO 25X1 1.5 SAFETY (NEEDLE) ×2 IMPLANT
NEEDLE MAYO TAPER (NEEDLE) ×1
NEEDLE SCORPION MULTI FIRE (NEEDLE) ×4 IMPLANT
NEEDLE SPNL 18GX3.5 QUINCKE PK (NEEDLE) ×2 IMPLANT
NS IRRIG 1000ML POUR BTL (IV SOLUTION) ×2 IMPLANT
PACK SHOULDER (CUSTOM PROCEDURE TRAY) ×2 IMPLANT
PAD ARMBOARD 7.5X6 YLW CONV (MISCELLANEOUS) ×4 IMPLANT
PUSHLOCK PEEK 4.5X24 (Orthopedic Implant) ×4 IMPLANT
RESTRAINT HEAD UNIVERSAL NS (MISCELLANEOUS) ×2 IMPLANT
SET ARTHROSCOPY TUBING (MISCELLANEOUS) ×1
SET ARTHROSCOPY TUBING LN (MISCELLANEOUS) ×1 IMPLANT
SLING ARM IMMOBILIZER MED (SOFTGOODS) IMPLANT
SPONGE LAP 4X18 X RAY DECT (DISPOSABLE) ×4 IMPLANT
STRIP CLOSURE SKIN 1/2X4 (GAUZE/BANDAGES/DRESSINGS) ×2 IMPLANT
SUCTION FRAZIER HANDLE 10FR (MISCELLANEOUS) ×1
SUCTION TUBE FRAZIER 10FR DISP (MISCELLANEOUS) ×1 IMPLANT
SUT ETHILON 3 0 PS 1 (SUTURE) ×4 IMPLANT
SUT FIBERWIRE #2 38 T-5 BLUE (SUTURE)
SUT PROLENE 3 0 PS 2 (SUTURE) IMPLANT
SUT VIC AB 0 CT1 27 (SUTURE) ×1
SUT VIC AB 0 CT1 27XBRD ANBCTR (SUTURE) ×1 IMPLANT
SUT VIC AB 1 CT1 27 (SUTURE) ×1
SUT VIC AB 1 CT1 27XBRD ANBCTR (SUTURE) ×1 IMPLANT
SUT VIC AB 2-0 CT1 27 (SUTURE) ×1
SUT VIC AB 2-0 CT1 TAPERPNT 27 (SUTURE) ×1 IMPLANT
SUT VIC AB 2-0 UR6 27 (SUTURE) ×8 IMPLANT
SUT VICRYL 0 UR6 27IN ABS (SUTURE) ×4 IMPLANT
SUTURE FIBERWR #2 38 T-5 BLUE (SUTURE) IMPLANT
SYR 20CC LL (SYRINGE) ×4 IMPLANT
SYR TB 1ML LUER SLIP (SYRINGE) ×2 IMPLANT
TOWEL OR 17X24 6PK STRL BLUE (TOWEL DISPOSABLE) ×2 IMPLANT
TOWEL OR 17X26 10 PK STRL BLUE (TOWEL DISPOSABLE) ×2 IMPLANT
WAND HAND CNTRL MULTIVAC 90 (MISCELLANEOUS) ×2 IMPLANT
WATER STERILE IRR 1000ML POUR (IV SOLUTION) ×2 IMPLANT

## 2016-09-16 NOTE — Brief Op Note (Signed)
09/16/2016  6:48 PM  PATIENT:  Robbie Lis  65 y.o. male  PRE-OPERATIVE DIAGNOSIS:  Rt. Shoulder Rotator Cuff Tear  POST-OPERATIVE DIAGNOSIS:  Rt. Shoulder Rotator Cuff Tear  PROCEDURE:  Procedure(s): SHOULDER DIAGNOSTIC OPERATIVE ARTHROSCOPY WITH MINI-OPEN ROTATOR CUFF REPAIR AND  BICEPS TENODESIS.  SURGEON:  Surgeon(s): Meredith Pel, MD  ASSISTANT: Laure Kidney rnfa  ANESTHESIA:   regional and general  EBL: 25 ml    Total I/O In: 1300 [I.V.:1300] Out: 50 [Blood:50]  BLOOD ADMINISTERED: none  DRAINS: none   LOCAL MEDICATIONS USED:  none  SPECIMEN:  No Specimen  COUNTS:  YES  TOURNIQUET:  * No tourniquets in log *  DICTATION: .Other Dictation: Dictation [id number]  PLAN OF CARE: Discharge to home after PACU  PATIENT DISPOSITION:  PACU - hemodynamically stable

## 2016-09-16 NOTE — Anesthesia Preprocedure Evaluation (Addendum)
Anesthesia Evaluation  Patient identified by MRN, date of birth, ID band Patient awake    Reviewed: Allergy & Precautions, NPO status , Patient's Chart, lab work & pertinent test results  Airway Mallampati: II  TM Distance: >3 FB Neck ROM: Full    Dental  (+) Teeth Intact, Dental Advisory Given   Pulmonary    breath sounds clear to auscultation       Cardiovascular negative cardio ROS   Rhythm:Regular Rate:Normal     Neuro/Psych PSYCHIATRIC DISORDERS Anxiety Depression    GI/Hepatic Neg liver ROS, GERD  ,  Endo/Other  negative endocrine ROS  Renal/GU negative Renal ROS  negative genitourinary   Musculoskeletal  (+) Arthritis , Osteoarthritis,    Abdominal   Peds negative pediatric ROS (+)  Hematology negative hematology ROS (+)   Anesthesia Other Findings   Reproductive/Obstetrics negative OB ROS                            Lab Results  Component Value Date   WBC 6.6 09/15/2016   HGB 16.2 09/15/2016   HCT 45.5 09/15/2016   MCV 87.0 09/15/2016   PLT 222 09/15/2016   No results found for: CREATININE, BUN, NA, K, CL, CO2 No results found for: INR, PROTIME  Anesthesia Physical Anesthesia Plan  ASA: II  Anesthesia Plan: General   Post-op Pain Management: GA combined w/ Regional for post-op pain   Induction: Intravenous  Airway Management Planned: Oral ETT  Additional Equipment:   Intra-op Plan:   Post-operative Plan: Extubation in OR  Informed Consent: I have reviewed the patients History and Physical, chart, labs and discussed the procedure including the risks, benefits and alternatives for the proposed anesthesia with the patient or authorized representative who has indicated his/her understanding and acceptance.   Dental advisory given  Plan Discussed with: CRNA  Anesthesia Plan Comments:         Anesthesia Quick Evaluation

## 2016-09-16 NOTE — Transfer of Care (Signed)
Immediate Anesthesia Transfer of Care Note  Patient: Wesley Gregory  Procedure(s) Performed: Procedure(s): SHOULDER DIAGNOSTIC OPERATIVE ARTHROSCOPY WITH MINI-OPEN ROTATOR CUFF REPAIR AND  BICEPS TENODESIS. (Right)  Patient Location: PACU  Anesthesia Type:GA combined with regional for post-op pain  Level of Consciousness: awake  Airway & Oxygen Therapy: Patient Spontanous Breathing  Post-op Assessment: Report given to RN and Post -op Vital signs reviewed and stable  Post vital signs: Reviewed and stable  Last Vitals:  Vitals:   09/16/16 1242 09/16/16 1905  Pulse: 77   Resp: 18   Temp: 36.9 C 36.5 C    Last Pain:  Vitals:   09/16/16 1308  TempSrc:   PainSc: 5       Patients Stated Pain Goal: 2 ([id number])  Complications: No apparent anesthesia complications

## 2016-09-16 NOTE — Anesthesia Procedure Notes (Signed)
Procedure Name: Intubation Date/Time: 09/16/2016 4:26 PM Performed by: Maude Leriche D Pre-anesthesia Checklist: Patient identified, Emergency Drugs available, Suction available, Patient being monitored and Timeout performed Patient Re-evaluated:Patient Re-evaluated prior to inductionOxygen Delivery Method: Circle system utilized Preoxygenation: Pre-oxygenation with 100% oxygen Intubation Type: IV induction Ventilation: Mask ventilation without difficulty Laryngoscope Size: Miller and 2 Grade View: Grade III Tube type: Oral Tube size: 7.5 mm Number of attempts: 1 Airway Equipment and Method: Stylet Placement Confirmation: ETT inserted through vocal cords under direct vision,  positive ETCO2 and breath sounds checked- equal and bilateral Secured at: 23 cm Tube secured with: Tape Dental Injury: Teeth and Oropharynx as per pre-operative assessment

## 2016-09-16 NOTE — Interval H&P Note (Signed)
History and Physical Interval Note:  09/16/2016 3:27 PM  Wesley Gregory  has presented today for surgery, with the diagnosis of Rt. Shoulder Rotator Cuff Tear  The various methods of treatment have been discussed with the patient and family. After consideration of risks, benefits and other options for treatment, the patient has consented to  Procedure(s): SHOULDER DIAGNOSTIC OPERATIVE ARTHROSCOPY WITH MINI-OPEN ROTATOR CUFF REPAIR AND POSSIBLE BICEPS TENODESIS. (Right) as a surgical intervention .  The patient's history has been reviewed, patient examined, no change in status, stable for surgery.  I have reviewed the patient's chart and labs.  Questions were answered to the patient's satisfaction.     Anderson Malta

## 2016-09-16 NOTE — Anesthesia Postprocedure Evaluation (Signed)
Anesthesia Post Note  Patient: Wesley Gregory  Procedure(s) Performed: Procedure(s) (LRB): SHOULDER DIAGNOSTIC OPERATIVE ARTHROSCOPY WITH MINI-OPEN ROTATOR CUFF REPAIR AND  BICEPS TENODESIS. (Right)  Patient location during evaluation: PACU Anesthesia Type: General Level of consciousness: awake and alert Pain management: pain level controlled Vital Signs Assessment: post-procedure vital signs reviewed and stable Respiratory status: spontaneous breathing, nonlabored ventilation, respiratory function stable and patient connected to nasal cannula oxygen Cardiovascular status: blood pressure returned to baseline and stable Postop Assessment: no signs of nausea or vomiting Anesthetic complications: no    Last Vitals:  Vitals:   09/16/16 1909 09/16/16 1930  BP: (!) 146/108 (!) 142/88  Pulse: 87 72  Resp: 15   Temp:  36.1 C    Last Pain:  Vitals:   09/16/16 1930  TempSrc:   PainSc: 0-No pain                 Chanler Mendonca S

## 2016-09-16 NOTE — Anesthesia Procedure Notes (Signed)
Anesthesia Regional Block:  Interscalene brachial plexus block  Pre-Anesthetic Checklist: ,, timeout performed, Correct Patient, Correct Site, Correct Laterality, Correct Procedure, Correct Position, site marked, Risks and benefits discussed,  Surgical consent,  Pre-op evaluation,  At surgeon's request and post-op pain management  Laterality: Right  Prep: chloraprep       Needles:  Injection technique: Single-shot  Needle Type: Echogenic Needle     Needle Length: 9cm 9 cm Needle Gauge: 21 and 21 G    Additional Needles:  Procedures: ultrasound guided (picture in chart) Interscalene brachial plexus block Narrative:  Start time: 09/16/2016 2:45 PM End time: 09/16/2016 2:47 PM Injection made incrementally with aspirations every 5 mL.  Performed by: Personally  Anesthesiologist: Suella Broad D  Additional Notes: No complications.

## 2016-09-17 ENCOUNTER — Encounter (HOSPITAL_COMMUNITY): Payer: Self-pay | Admitting: Orthopedic Surgery

## 2016-09-17 NOTE — Op Note (Signed)
NAMELUMAN, BASSET NO.:  [id number]  MEDICAL RECORD NO.:  [id number]  LOCATION:  [facility]                         FACILITY:  [facility]  PHYSICIAN:  Anderson Malta, M.D.    DATE OF BIRTH:  [birth year]  DATE OF PROCEDURE: DATE OF DISCHARGE:  09/16/2016                              OPERATIVE REPORT   PREOPERATIVE DIAGNOSIS:  Right shoulder rotator cuff tear and biceps tendinitis.  POSTOPERATIVE DIAGNOSIS:  Right shoulder massive rotator cuff tear involving the infraspinatus and supraspinatus with retraction as well as biceps tendinitis.  PROCEDURE:  Right shoulder arthroscopy, limited debridement of the labrum with release of the biceps tendon, mini open rotator cuff tear, repair of the supraspinatus and infraspinatus along with biceps tenodesis.  SURGEON:  Anderson Malta, M.D.  ASSISTANT:  Laure Kidney, RNFA.  INDICATIONS:  Larwence is a 65 year old patient with right shoulder pain, presents for operative management of significant right shoulder rotator cuff tear following an injury falling off a horse.  OPERATIVE FINDINGS: 1. Examination under anesthesia, range of motion 0-180 passive forward     flexion, external rotation of 15 degrees, abduction is about 60,     shoulder stability is 1+, anterior and posterior with less than a     centimeter sulcus sign. 2. Diagnostic arthroscopy:  Tear of the infraspinatus and     supraspinatus tendon with retraction to the top of the humeral     head. 3. Biceps tendinitis. 4. Intact subscapularis. 5. Early grade 1-2 degenerate changes on the humeral head over about     25% of its surface area.  PROCEDURE IN DETAIL:  The patient was brought to the operating room where general anesthetic was induced.  The patient was placed in a beach- chair position with the head in neutral position and in a position that was comfortable for him prior to going to sleep.  The patient has had prior neck surgery.  The right shoulder was then  prescrubbed with alcohol and Betadine, let air dry, prepped with DuraPrep solution, and draped in a sterile manner.  Charlie Pitter was used to cover the axilla as well as the periphery of the operative field  __________ portion of the case at which time the entire operative field was covered.  Time-out was called.  Portal was created 2 cm posterior and medial to the posterolateral margin of the acromion.  Diagnostic arthroscopy demonstrated rotator cuff tear along with some early degenerative changes of the humeral head.  No full-thickness chondral loss was present.  Biceps tendon have a little bit of a perched, slightly medial tract and had significant tendonitis and interstitial tearing.  Biceps tendon was released, superior labrum debrided.  Anterior-inferior, posterior-inferior glenohumeral ligaments and labrum were intact.  At this time, following biceps tendon release after creation of an anterior portal under direct visualization, instruments were removed.  Portals were closed using 3-0 nylon, and Ioban used to cover the operative field.  An incision off the anterolateral margin acromion was made. Deltoid was split and measured a distance of 4 cm, marked with a #1 Vicryl suture.  Rotator cuff tear was then mobilized, and tuberoplasty was performed.  Stay sutures were  placed in the rotator cuff tear along its edges.  This was a 0 Vicryl suture.  At this time, the rotator cuff mobilization back to cover about 50%-60% of the footprint.  The 3 corkscrew suture anchors were passed.  The 12 tapes were then passed medial within the tendon, and then, they were tied sequentially front to back with traction on the rotator cuff.  They were then secured forming a near 95% watertight repair of the rotator cuff except the leading edge of the supraspinatus, which was then sutured to the rotator interval and the subscap.  The 2 edges were crossed and then secured using PushLocks. Good repair was achieved.   At this time, thorough irrigation was performed.  Deltoid split closed using #1 Vicryl suture followed by interrupted inverted 0 Vicryl suture, 2-0 Vicryl suture, and Monocryl. Waterproof dressing was placed.  The patient tolerated the procedure well without immediate complication, transferred to the recovery room in stable condition.     Anderson Malta, M.D.     GSD/MEDQ  D:  09/16/2016  T:  09/17/2016  Job:  [id number]

## 2016-09-18 ENCOUNTER — Encounter (INDEPENDENT_AMBULATORY_CARE_PROVIDER_SITE_OTHER): Payer: Self-pay | Admitting: Orthopedic Surgery

## 2016-09-18 ENCOUNTER — Telehealth (INDEPENDENT_AMBULATORY_CARE_PROVIDER_SITE_OTHER): Payer: Self-pay | Admitting: Radiology

## 2016-09-18 NOTE — Telephone Encounter (Signed)
Beth called me back and I advised per Dr Marlou Sa that pt can take 2 oxy 5 mg per 4 hrs for severe pain.  Only take 1 robaxin q 8 hrs.  She understands.  [facility] and [facility] to [facility] out to [place] about CPM as we have been unable to reach them.  She will call me back.

## 2016-09-18 NOTE — Progress Notes (Signed)
I called Wesley Gregory and she said the [place] did NOT tell her the cpm would be covered.  They said they would work on it and call either her or [place] back with an answer.

## 2016-09-18 NOTE — Telephone Encounter (Signed)
Wesley Gregory called for patient.  He is in severe pain postop.  He does not have his CPM machine yet.  (shoulder surgery 09/16/16)

## 2016-09-18 NOTE — Progress Notes (Unsigned)
[name] called, said [facility] and they said [facility] would [facility] the CPM.  Can you call her and see what/if anything we need to do?  Thanks.

## 2016-09-18 NOTE — Telephone Encounter (Signed)
IC and phone rang, then silent.  Will try to call again. Wesley Gregory has tried to call the [place] with no success.  Hold times are 1 hr and we cannot get anyone to answer.  I will ask the patient to call them to see if they would call us so we can discuss the auth for CPM. Dr Marlou Sa aware.

## 2016-09-18 NOTE — Telephone Encounter (Signed)
IC patient's number as well, LMVM to call me back.

## 2016-09-24 ENCOUNTER — Ambulatory Visit (INDEPENDENT_AMBULATORY_CARE_PROVIDER_SITE_OTHER): Payer: No Typology Code available for payment source | Admitting: Orthopedic Surgery

## 2016-09-24 ENCOUNTER — Encounter (INDEPENDENT_AMBULATORY_CARE_PROVIDER_SITE_OTHER): Payer: Self-pay | Admitting: Orthopedic Surgery

## 2016-09-24 DIAGNOSIS — M75121 Complete rotator cuff tear or rupture of right shoulder, not specified as traumatic: Secondary | ICD-10-CM

## 2016-09-24 MED ORDER — METHOCARBAMOL 500 MG PO TABS: 500.0000 mg | ORAL_TABLET | Freq: Three times a day (TID) | ORAL | 0 refills | Status: DC | PRN

## 2016-09-24 MED ORDER — OXYCODONE HCL 5 MG PO CAPS: 5.0000 mg | ORAL_CAPSULE | ORAL | 0 refills | Status: DC | PRN

## 2016-09-24 NOTE — Progress Notes (Signed)
   Post-Op Visit Note   Patient: Wesley Gregory           Date of Birth: 02/05/1951           MRN: [id number] Visit Date: 09/24/2016 PCP: Pcp Not In System   Assessment & Plan:  Chief Complaint:  Chief Complaint  Patient presents with  . Right Shoulder - Routine Post Op   Visit Diagnoses:  1. Complete tear of right rotator cuff     Plan: Wesley Gregory is now 1 week out right shoulder massive rotator cuff tear repair has been doing well he hasn't done any CPM yet because it hasn't been approved yet by the [place].  On exam the passive range of motion until smooth deltoid fires plan the suture removal today refill his pain meds and muscle relaxers start physical therapy for passive range of motion only follow up in 2 weeks for recheck  Follow-Up Instructions: Return in about 2 years (around 09/24/2018).   Orders:  No orders of the defined types were placed in this encounter.  No orders of the defined types were placed in this encounter.    PMFS History: There are no active problems to display for this patient.  Past Medical History:  Diagnosis Date  . Anxiety   . Arthritis   . Depression   . DVT (deep venous thrombosis) ([place])   . Dyspnea    with injury  . GERD (gastroesophageal reflux disease)   . Head injury with loss of consciousness ([place])    short loss of consciouness, numerous head injuries  . History of kidney stones    Passed a couple  . PTSD (post-traumatic stress disorder)     No family history on file.  Past Surgical History:  Procedure Laterality Date  . ANKLE SURGERY Left    fracture repair- pins  . CERVIX SURGERY  2009  . COLON RESECTION  1980's   abdominal injury  . COLONOSCOPY     x 2  . CYSTOSCOPY    . KNEE ARTHROSCOPY Bilateral   . MANDIBLE FRACTURE SURGERY    . SHOULDER ARTHROSCOPY     x 3 rotatoar cuff  . SHOULDER ARTHROSCOPY WITH ROTATOR CUFF REPAIR AND OPEN BICEPS TENODESIS Right 09/16/2016   Procedure: SHOULDER DIAGNOSTIC OPERATIVE ARTHROSCOPY WITH  MINI-OPEN ROTATOR CUFF REPAIR AND  BICEPS TENODESIS.;  Surgeon: Meredith Pel, MD;  Location: [facility];  Service: Orthopedics;  Laterality: Right;   Social History   Occupational History  . Not on file.   Social History Main Topics  . Smoking status: Never Smoker  . Smokeless tobacco: Never Used  . Alcohol use No  . Drug use: No  . Sexual activity: Not on file

## 2016-09-29 ENCOUNTER — Telehealth (INDEPENDENT_AMBULATORY_CARE_PROVIDER_SITE_OTHER): Payer: Self-pay | Admitting: Orthopedic Surgery

## 2016-09-29 NOTE — Telephone Encounter (Signed)
Patient had surgery Nov 14th with Dean for R rotator cuff tear.  He is still in a sling and he is doing his exercise at home.  He was given a script and is waiting for someone to advise him of where to go with it.  Also, he states he was to be fitted for CPM and he has heard nothing.  Patient has [place]

## 2016-09-30 ENCOUNTER — Telehealth (HOSPITAL_COMMUNITY): Payer: Self-pay

## 2016-09-30 NOTE — Telephone Encounter (Signed)
09/30/16  I received a call from Mr. Solan on 11/27 to schedule therapy on his right shoulder/bicep.  Dr. Marlou Sa has done surgery.  He has [employer] and I advised him that I would need to call and get approval before I could schedule him.  He told me that Dr. Marlou Sa had all the necessary paperwork for therapy and I could get the information I need from the drs. Office.  I called the office and spoke with the insurance person and the only thing they have is approval for the surgery.  I have spoken to Mr. Arshad 3 times today and he gave me different phone numbers to the [place] and I have left several messages and finally spoke with a lady and she advised me that they were switching to a new system .... Nothing shows that he has been approved for therapy.  This lady was going to call the patient and make him aware of this....  They need a community care consult from his primary care dr. And they said this was a work in progress.  I asked what I should do and they say nothing at this point.  She will advise him that I can't schedule any appointments for therapy until this gets resolved.

## 2016-10-01 NOTE — Telephone Encounter (Signed)
IC and s/w Wesley Gregory and advised [facility] and they have reached out to the [place] and asked for these auths.  There is on specific turnaround on this.  I will f/u on this and call them back at end of week if I have not heard from them.

## 2016-10-01 NOTE — Telephone Encounter (Signed)
[facility] informed [facility] that Dr.Dean is supposed to be in touch with Dr.Zanga regarding his physical therapy post surgery.  Patient also is asking if he should do any more home therapy other that what Dr.Dean has already told him to do while he is waiting for approval for physical therapy.   I let him know that 2 requests have been sent to the [place] with no response as of now.   Please call [phone]

## 2016-10-02 NOTE — Telephone Encounter (Signed)
VM from Wesley Gregory, [place] with Ortho at the [facility].  He is requesting we fax the order for CPM and PT directly to him at [phone].  His ph number [phone], ext [phone].  Will fax order then followup with patient to make sure it is done.

## 2016-10-03 NOTE — Telephone Encounter (Signed)
IC pt and advised that I faxed orders, I gave him their number and I have asked him to let me know what the outcome is.

## 2016-10-15 ENCOUNTER — Ambulatory Visit (INDEPENDENT_AMBULATORY_CARE_PROVIDER_SITE_OTHER): Payer: No Typology Code available for payment source | Admitting: Orthopedic Surgery

## 2016-10-16 NOTE — Telephone Encounter (Signed)
IC pt and discussed, he has CPM and PT is situated thru [place] per pt.  I have resched his 12/13 appt to 12/20 at 9am with Dr Marlou Sa

## 2016-10-22 ENCOUNTER — Ambulatory Visit (INDEPENDENT_AMBULATORY_CARE_PROVIDER_SITE_OTHER): Payer: Self-pay | Admitting: Orthopedic Surgery

## 2016-10-22 ENCOUNTER — Encounter (INDEPENDENT_AMBULATORY_CARE_PROVIDER_SITE_OTHER): Payer: Self-pay | Admitting: Orthopedic Surgery

## 2016-10-22 DIAGNOSIS — M75121 Complete rotator cuff tear or rupture of right shoulder, not specified as traumatic: Secondary | ICD-10-CM

## 2016-10-22 NOTE — Progress Notes (Signed)
   Post-Op Visit Note   Patient: Wesley Gregory           Date of Birth: 03-27-1951           MRN: [id number] Visit Date: 10/22/2016 PCP: Pcp Not In System   Assessment & Plan:  Chief Complaint:  Chief Complaint  Patient presents with  . Right Shoulder - Routine Post Op   Visit Diagnoses:  1. Complete tear of right rotator cuff     Plan: Wesley Gregory is a 65 year old patient with right shoulder rotator cuff tear status post surgical fixation is doing well.  He is 5 weeks out.  He's using a CPM machine.  He has not done therapy yet.  Having some issues getting therapy approved by the [place].  On exam he does have smooth range of motion with passive motion.  External rotation strength is improved.  Plan at this time is to start strengthening exercises at the beginning of the year.  I gave him a sheet of exercises and went over it with him.  I'll see him back in 6 weeks for final check.  Follow-Up Instructions: No Follow-up on file.   Orders:  No orders of the defined types were placed in this encounter.  No orders of the defined types were placed in this encounter.    PMFS History: Patient Active Problem List   Diagnosis Date Noted  . Complete tear of right rotator cuff 10/22/2016   Past Medical History:  Diagnosis Date  . Anxiety   . Arthritis   . Depression   . DVT (deep venous thrombosis) ([place])   . Dyspnea    with injury  . GERD (gastroesophageal reflux disease)   . Head injury with loss of consciousness ([place])    short loss of consciouness, numerous head injuries  . History of kidney stones    Passed a couple  . PTSD (post-traumatic stress disorder)     No family history on file.  Past Surgical History:  Procedure Laterality Date  . ANKLE SURGERY Left    fracture repair- pins  . CERVIX SURGERY  2009  . COLON RESECTION  1980's   abdominal injury  . COLONOSCOPY     x 2  . CYSTOSCOPY    . KNEE ARTHROSCOPY Bilateral   . MANDIBLE FRACTURE SURGERY    . SHOULDER ARTHROSCOPY      x 3 rotatoar cuff  . SHOULDER ARTHROSCOPY WITH ROTATOR CUFF REPAIR AND OPEN BICEPS TENODESIS Right 09/16/2016   Procedure: SHOULDER DIAGNOSTIC OPERATIVE ARTHROSCOPY WITH MINI-OPEN ROTATOR CUFF REPAIR AND  BICEPS TENODESIS.;  Surgeon: Meredith Pel, MD;  Location: [facility];  Service: Orthopedics;  Laterality: Right;   Social History   Occupational History  . Not on file.   Social History Main Topics  . Smoking status: Never Smoker  . Smokeless tobacco: Never Used  . Alcohol use No  . Drug use: No  . Sexual activity: Not on file

## 2016-11-07 ENCOUNTER — Ambulatory Visit (HOSPITAL_COMMUNITY): Payer: No Typology Code available for payment source

## 2016-11-11 ENCOUNTER — Encounter (HOSPITAL_COMMUNITY): Payer: Self-pay | Admitting: Physical Therapy

## 2016-11-11 ENCOUNTER — Ambulatory Visit (HOSPITAL_COMMUNITY): Payer: No Typology Code available for payment source | Attending: Orthopedic Surgery | Admitting: Physical Therapy

## 2016-11-11 DIAGNOSIS — M6281 Muscle weakness (generalized): Secondary | ICD-10-CM | POA: Insufficient documentation

## 2016-11-11 DIAGNOSIS — M25511 Pain in right shoulder: Secondary | ICD-10-CM | POA: Diagnosis present

## 2016-11-11 DIAGNOSIS — M25611 Stiffness of right shoulder, not elsewhere classified: Secondary | ICD-10-CM | POA: Insufficient documentation

## 2016-11-11 NOTE — Therapy (Signed)
[place], [place], [address] Phone: [phone]   Fax:  [phone]  Physical Therapy Evaluation  Patient Details  Name: Wesley Gregory MRN: [id number] Date of Birth: 1951/01/18 Referring Provider: Alphonzo Severance, MD  Encounter Date: 11/11/2016      PT End of Session - 11/11/16 1528    Visit Number 1   Number of Visits 17   Date for PT Re-Evaluation 12/09/16   Authorization Type [employer] choice    Authorization Time Period 11/11/16 to 01/06/17   Authorization - Number of Visits 20   PT Start Time [id number]   PT Stop Time 1520   PT Time Calculation (min) 48 min   Activity Tolerance No increased pain;Patient tolerated treatment well   Behavior During Therapy [facility] for tasks assessed/performed      Past Medical History:  Diagnosis Date  . Anxiety   . Arthritis   . Depression   . DVT (deep venous thrombosis) ([place])   . Dyspnea    with injury  . GERD (gastroesophageal reflux disease)   . Head injury with loss of consciousness ([place])    short loss of consciouness, numerous head injuries  . History of kidney stones    Passed a couple  . PTSD (post-traumatic stress disorder)     Past Surgical History:  Procedure Laterality Date  . ANKLE SURGERY Left    fracture repair- pins  . CERVIX SURGERY  2009  . COLON RESECTION  1980's   abdominal injury  . COLONOSCOPY     x 2  . CYSTOSCOPY    . KNEE ARTHROSCOPY Bilateral   . MANDIBLE FRACTURE SURGERY    . SHOULDER ARTHROSCOPY     x 3 rotatoar cuff  . SHOULDER ARTHROSCOPY WITH ROTATOR CUFF REPAIR AND OPEN BICEPS TENODESIS Right 09/16/2016   Procedure: SHOULDER DIAGNOSTIC OPERATIVE ARTHROSCOPY WITH MINI-OPEN ROTATOR CUFF REPAIR AND  BICEPS TENODESIS.;  Surgeon: Meredith Pel, MD;  Location: [place];  Service: Orthopedics;  Laterality: Right;    There were no vitals filed for this visit.       Subjective Assessment - 11/11/16 1441    Subjective Pt reports being kicked off of  a horse back in April 2017. He went the following day and was sent around to various physicians until he got an MRI which found a torn RTC and biceps tendon. He had surgery on 09/16/16 to repair his shoulder. Due to insurance issues he was delayed coming into PT. He has been using his CPM at home. He reports that his arm bothers him occasionally but he is not having to take pain medication very often.    Pertinent History Anxiety, arthritis, GERD, PTSD, RTC repair on each shoulder prior to this surgery.    Patient Stated Goals improve use of Rt shoulder    Currently in Pain? No/denies            [facility] PT Assessment - 11/11/16 0001      Assessment   Medical Diagnosis Rt RTC/biceps tendon repair   Referring Provider Alphonzo Severance, MD   Onset Date/Surgical Date 09/16/16   Hand Dominance Right   Next MD Visit 12/01/16   Prior Therapy none      Precautions   Precautions Shoulder     Balance Screen   Has the patient fallen in the past 6 months No   Has the patient had a decrease in activity level because of a fear of falling?  No  Is the patient reluctant to leave their home because of a fear of falling?  No     Prior Function   Level of Independence Independent     Cognition   Overall Cognitive Status Within Functional Limits for tasks assessed     Observation/Other Assessments   Observations incision intact and healing well      Sensation   Light Touch Appears Intact     ROM / Strength   AROM / PROM / Strength PROM;Strength     PROM   PROM Assessment Site Shoulder   Right/Left Shoulder Right   Right Shoulder Flexion 125 Degrees   Right Shoulder ABduction 90 Degrees   Right Shoulder External Rotation 35 Degrees  35 deg @ 30 deg abd; 45 deg @ 70 deg abd      Strength   Overall Strength Unable to assess  secondart limitations per protocol     Palpation   Palpation comment tenderness with palpation along anterior shoulder, head of bicep                    [facility] Adult PT Treatment/Exercise - 11/11/16 0001      Exercises   Exercises Shoulder;Other Exercises   Other Exercises  Rt shoulder flexion/abduction/ER/extension isometric into wall 2x3 sec hold each for HEP demo      Shoulder Exercises: Seated   Flexion PROM;5 reps   Flexion Limitations 10 sec hold end range    Abduction PROM;5 reps   ABduction Limitations 10 sec hold end range      Shoulder Exercises: Standing   External Rotation PROM;Right;5 reps   External Rotation Limitations against wall, 10 sec hold                 PT Education - 11/11/16 1527    Education provided Yes   Education Details discussed typical course of RTC progression with [email or web address]; initiated HEP and reviewed for proper technique; eval findings/POC   Person(s) Educated Patient   Methods Explanation;Demonstration;Handout;Verbal cues   Comprehension Verbalized understanding;Returned demonstration          PT Short Term Goals - 11/11/16 1539      PT SHORT TERM GOAL #1   Title pt will demo consistency and independence with his HEP to improve shoulder ROM and strength    Time 2   Period Weeks   Status New     PT SHORT TERM GOAL #2   Title Pt will demo improved shoulder PROM to within 5 degrees of his LUE without report of pain, to allow progression to strengthening exercises.    Time 2   Period Weeks   Status New     PT SHORT TERM GOAL #3   Title Pt will demo Rt shoulder strength to atleast 3/5 MMT to allow him to dress and perform other self grooming tasks independently.    Time 4   Period Weeks   Status New           PT Long Term Goals - 11/11/16 1541      PT LONG TERM GOAL #1   Title Pt will demo improved shoulder AROM to within 5 degrees of his LUE, to allow him to perform light overhead activities such as chaning a light bulb or putting on his hat without difficulty.    Time 6   Period Weeks   Status New     PT LONG TERM GOAL #2   Title Pt will demo improved  shoulder strength,  ROM and power evident by his ability to report throwing a rope overhead atleast 35 ft and without more than 3/10 pain.    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    Time 8   Period Weeks   Status New               Plan - 12/09/2016 1529    Clinical Impression Statement Pt is a pleasant 66yo M referred to OPPT 8 weeks s/p Rt RTC repair and biceps tenodesis on 09/16/17. He was delayed beginning PT secondary to paperwork issues with insurance, however he has been using the CPM and doing several exercises provided by his surgeon in the meantime. He presents today with post-surgical pain and limitations in Rt shoulder Passive ROM, with about 90 deg abduction, 125 deg flexion and 30-40 deg of shoulder External Rotation. Strength testing was deferred to a later time in order to follow post-op protocols. Session was spent introducing a detailed HEP to address limitations in shoulder Passive ROM and increase shoulder muscle activation with partial isometric contractions. Pt was able to demonstrate proper technique after review of his HEP. Therapist discussed eval findings and POC with the PT who is in agreement with proposed PT frequency. 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    Rehab Potential Good   Clinical Impairments Affecting Rehab Potential (+) motivated to return to work; (-) delayed getting into rehab   PT Frequency 2x / week   PT Duration 8 weeks   PT Treatment/Interventions ADLs/Self Care Home Management;Cryotherapy;Moist Heat;Therapeutic exercise;Therapeutic activities;Functional mobility training;Neuromuscular re-education;Patient/family education;Manual techniques;Scar  mobilization;Passive range of motion;Dry needling   PT Next Visit Plan Review HEP set up; scapular stabilization exercises: serratus punches, scap squeezes, etc.; follow up with MD protocol?   PT Home Exercise Plan table slides for ROM: flexion/ABD/ER (against wall) 10x10 sec hold each; wall isometrics with elbow by side: flexion, extension, abd, IR (less than 50% effort) 10x5 sec hold   Recommended Other Services none    Consulted and Agree with Plan of Care Patient      Patient will benefit from skilled therapeutic intervention in order to improve the following deficits and impairments:  Decreased activity tolerance, Decreased endurance, Decreased strength, Decreased scar mobility, Decreased range of motion, Increased fascial restricitons, Impaired flexibility, Impaired UE functional use, Pain, Increased muscle spasms, Hypomobility  Visit Diagnosis: Right shoulder pain, unspecified chronicity  Stiffness of right shoulder, not elsewhere classified  Muscle weakness (generalized)      G-Codes - 12/09/16 1544    Functional Assessment Tool Used FOTO: 54% limited    Functional Limitation Carrying, moving and handling objects   Carrying, Moving and Handling Objects Current Status [phone]) At least 40 percent but less than 60 percent impaired, limited or restricted   Carrying, Moving and Handling Objects Goal Status [id number]) At least 20 percent but less than 40 percent impaired, limited or restricted       Problem List Patient Active Problem List   Diagnosis Date Noted  . Complete tear of right rotator cuff 10/22/2016   3:47 PM,12-09-16 Elly Modena PT, [facility] [phone]  [place], [place], [address] Phone: [phone]   Fax:  [phone]  Name: Wesley Gregory MRN: [id number] Date of Birth: [birth year]

## 2016-11-14 ENCOUNTER — Ambulatory Visit (HOSPITAL_COMMUNITY): Payer: No Typology Code available for payment source | Admitting: Physical Therapy

## 2016-11-14 DIAGNOSIS — M6281 Muscle weakness (generalized): Secondary | ICD-10-CM

## 2016-11-14 DIAGNOSIS — M25511 Pain in right shoulder: Secondary | ICD-10-CM

## 2016-11-14 DIAGNOSIS — M25611 Stiffness of right shoulder, not elsewhere classified: Secondary | ICD-10-CM

## 2016-11-14 NOTE — Therapy (Signed)
[place], [place], [address] Phone: [phone]   Fax:  [phone]  Physical Therapy Treatment  Patient Details  Name: Wesley Gregory MRN: [id number] Date of Birth: September 01, 1951 Referring Provider: Alphonzo Severance, MD  Encounter Date: 11/14/2016      PT End of Session - 11/14/16 1518    Visit Number 2   Number of Visits 17   Date for PT Re-Evaluation 12/09/16   Authorization Type VA choice    Authorization Time Period 11/11/16 to 01/06/17   Authorization - Visit Number 2   Authorization - Number of Visits 20   PT Start Time [id number]   PT Stop Time [id number]   PT Time Calculation (min) 42 min   Activity Tolerance No increased pain;Patient tolerated treatment well   Behavior During Therapy [facility] for tasks assessed/performed      Past Medical History:  Diagnosis Date  . Anxiety   . Arthritis   . Depression   . DVT (deep venous thrombosis) ([place])   . Dyspnea    with injury  . GERD (gastroesophageal reflux disease)   . Head injury with loss of consciousness ([place])    short loss of consciouness, numerous head injuries  . History of kidney stones    Passed a couple  . PTSD (post-traumatic stress disorder)     Past Surgical History:  Procedure Laterality Date  . ANKLE SURGERY Left    fracture repair- pins  . CERVIX SURGERY  2009  . COLON RESECTION  1980's   abdominal injury  . COLONOSCOPY     x 2  . CYSTOSCOPY    . KNEE ARTHROSCOPY Bilateral   . MANDIBLE FRACTURE SURGERY    . SHOULDER ARTHROSCOPY     x 3 rotatoar cuff  . SHOULDER ARTHROSCOPY WITH ROTATOR CUFF REPAIR AND OPEN BICEPS TENODESIS Right 09/16/2016   Procedure: SHOULDER DIAGNOSTIC OPERATIVE ARTHROSCOPY WITH MINI-OPEN ROTATOR CUFF REPAIR AND  BICEPS TENODESIS.;  Surgeon: Meredith Pel, MD;  Location: [facility];  Service: Orthopedics;  Laterality: Right;    There were no vitals filed for this visit.      Subjective Assessment - 11/14/16 1436    Subjective Pt reports that he forgot his exercises the other day but has tried to perform them on his own. He has no more pain than normal, and no other complaints.    Pertinent History Anxiety, arthritis, GERD, PTSD, RTC repair on each shoulder prior to this surgery.    Patient Stated Goals improve use of Rt shoulder    Currently in Pain? No/denies                         [facility] Adult PT Treatment/Exercise - 11/14/16 0001      Shoulder Exercises: Supine   Other Supine Exercises serratus punches with tactile/verbal cues for technique, x15 reps      Shoulder Exercises: Seated   Retraction Both;15 reps   Flexion AAROM;Right;10 reps   Flexion Limitations 10 sec hold    Abduction Right;10 reps   ABduction Limitations 10 sec hold      Shoulder Exercises: Isometric Strengthening   Flexion Limitations 10x5 sec, 50% effort    Extension Limitations 10x5 sec, 50% effort    External Rotation Limitations 10x5 sec, 50% effort    ABduction Limitations 10x5 sec, 50% effort      Shoulder Exercises: Stretch   External Rotation Stretch 10 seconds;Other (comment)  x10 reps,  RUE, 0 deg abd      Manual Therapy   Manual Therapy Myofascial release;Passive ROM   Manual therapy comments separate rest of session   Myofascial Release Anterior deltoid region    Passive ROM RUE ER hold x10 sec at 70 deg ABD                PT Education - 11/14/16 1517    Education provided Yes   Education Details reviewed HEP with 1 addition; technique with therex   Person(s) Educated Patient   Methods Explanation;Demonstration;Verbal cues   Comprehension Verbalized understanding;Returned demonstration          PT Short Term Goals - 11/11/16 1539      PT SHORT TERM GOAL #1   Title pt will demo consistency and independence with his HEP to improve shoulder ROM and strength    Time 2   Period Weeks   Status New     PT SHORT TERM GOAL #2   Title Pt will demo improved shoulder PROM to  within 5 degrees of his LUE without report of pain, to allow progression to strengthening exercises.    Time 2   Period Weeks   Status New     PT SHORT TERM GOAL #3   Title Pt will demo Rt shoulder strength to atleast 3/5 MMT to allow him to dress and perform other self grooming tasks independently.    Time 4   Period Weeks   Status New           PT Long Term Goals - 11/11/16 1541      PT LONG TERM GOAL #1   Title Pt will demo improved shoulder AROM to within 5 degrees of his LUE, to allow him to perform light overhead activities such as chaning a light bulb or putting on his hat without difficulty.    Time 6   Period Weeks   Status New     PT LONG TERM GOAL #2   Title Pt will demo improved shoulder strength, ROM and power evident by his ability to report throwing a rope overhead atleast 35 ft and without more than 3/10 pain.    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    Time 8   Period Weeks   Status New               Plan - 11/14/16 1518    Clinical Impression Statement Pt arrived today having been performing his HEP as requested. He demonstrated good technique with these exercises with only minor adjustments needed. The remainder of the session focused on a combination of manual techniques and scapular stabilization exercises to improve overall shoulder mobility. Noted increased difficulty with scapular mobility exercises, so the plan is to further address this at his next session.   Rehab Potential Good   Clinical Impairments Affecting Rehab Potential (+) motivated to return to work; (-) delayed getting into rehab   PT Frequency 2x / week   PT Duration 8 weeks   PT Treatment/Interventions ADLs/Self Care Home Management;Cryotherapy;Moist Heat;Therapeutic exercise;Therapeutic activities;Functional mobility training;Neuromuscular  re-education;Patient/family education;Manual techniques;Scar mobilization;Passive range of motion;Dry needling   PT Next Visit Plan Scapular mobilizations, scapular stabilization exercises: serratus punches, scap squeezes, AAROM if PROM into ABD has improved  PT Home Exercise Plan table slides for ROM: flexion/ABD/ER (against wall) 10x10 sec hold each; wall isometrics with elbow by side: flexion, extension, abd, IR (less than 50% effort) 10x5 sec hold, scap squeezes   Consulted and Agree with Plan of Care Patient      Patient will benefit from skilled therapeutic intervention in order to improve the following deficits and impairments:  Decreased activity tolerance, Decreased endurance, Decreased strength, Decreased scar mobility, Decreased range of motion, Increased fascial restricitons, Impaired flexibility, Impaired UE functional use, Pain, Increased muscle spasms, Hypomobility  Visit Diagnosis: Right shoulder pain, unspecified chronicity  Stiffness of right shoulder, not elsewhere classified  Muscle weakness (generalized)     Problem List Patient Active Problem List   Diagnosis Date Noted  . Complete tear of right rotator cuff 10/22/2016    3:23 PM,11/14/16 Elly Modena PT, DPT [name] [place] [address] [place], [place], [address] Phone: [phone]   Fax:  [phone]  Name: TEODULO FONTENETTE MRN: [id number] Date of Birth: 05-Nov-1950

## 2016-11-18 ENCOUNTER — Ambulatory Visit (HOSPITAL_COMMUNITY): Payer: No Typology Code available for payment source

## 2016-11-20 ENCOUNTER — Encounter (HOSPITAL_COMMUNITY): Payer: No Typology Code available for payment source

## 2016-11-21 ENCOUNTER — Telehealth (HOSPITAL_COMMUNITY): Payer: Self-pay

## 2016-11-21 ENCOUNTER — Encounter (HOSPITAL_COMMUNITY): Payer: No Typology Code available for payment source | Admitting: Physical Therapy

## 2016-11-21 NOTE — Telephone Encounter (Signed)
11/21/16 pt left a message on 1-18 to cx today's appointment because he is stuck in [place].  He said he would try and make it but if we could give someone else the appt go ahead and do that

## 2016-11-25 ENCOUNTER — Ambulatory Visit (HOSPITAL_COMMUNITY): Payer: No Typology Code available for payment source | Admitting: Physical Therapy

## 2016-11-25 DIAGNOSIS — M6281 Muscle weakness (generalized): Secondary | ICD-10-CM

## 2016-11-25 DIAGNOSIS — M25611 Stiffness of right shoulder, not elsewhere classified: Secondary | ICD-10-CM

## 2016-11-25 DIAGNOSIS — M25511 Pain in right shoulder: Secondary | ICD-10-CM

## 2016-11-25 NOTE — Therapy (Signed)
[facility], [place], [address] Phone: [phone]   Fax:  [phone]  Physical Therapy Treatment  Patient Details  Name: RENOLD SUITE MRN: [id number] Date of Birth: 24-Jun-1951 Referring Provider: Alphonzo Severance, MD  Encounter Date: 11/25/2016      PT End of Session - 11/25/16 1544    Visit Number 3   Number of Visits 17   Date for PT Re-Evaluation 12/09/16   Authorization Type [facility] choice    Authorization Time Period 11/11/16 to 01/06/17   Authorization - Visit Number 3   Authorization - Number of Visits 20   PT Start Time [id number]   PT Stop Time [id number]   PT Time Calculation (min) 41 min   Activity Tolerance No increased pain;Patient tolerated treatment well   Behavior During Therapy [facility] for tasks assessed/performed      Past Medical History:  Diagnosis Date  . Anxiety   . Arthritis   . Depression   . DVT (deep venous thrombosis) ([place])   . Dyspnea    with injury  . GERD (gastroesophageal reflux disease)   . Head injury with loss of consciousness ([place])    short loss of consciouness, numerous head injuries  . History of kidney stones    Passed a couple  . PTSD (post-traumatic stress disorder)     Past Surgical History:  Procedure Laterality Date  . ANKLE SURGERY Left    fracture repair- pins  . CERVIX SURGERY  2009  . COLON RESECTION  1980's   abdominal injury  . COLONOSCOPY     x 2  . CYSTOSCOPY    . KNEE ARTHROSCOPY Bilateral   . MANDIBLE FRACTURE SURGERY    . SHOULDER ARTHROSCOPY     x 3 rotatoar cuff  . SHOULDER ARTHROSCOPY WITH ROTATOR CUFF REPAIR AND OPEN BICEPS TENODESIS Right 09/16/2016   Procedure: SHOULDER DIAGNOSTIC OPERATIVE ARTHROSCOPY WITH MINI-OPEN ROTATOR CUFF REPAIR AND  BICEPS TENODESIS.;  Surgeon: Meredith Pel, MD;  Location: [facility];  Service: Orthopedics;  Laterality: Right;    There were no vitals filed for this visit.      Subjective Assessment - 11/25/16 1544    Subjective Pt states that he fell down some stairs about 1 week ago and landed on his elbow. He has been sore but this is slowly improving. Denies any bruising or swelling. He had to hold off on his HEP initially, but he has picked back up with this.    Pertinent History Anxiety, arthritis, GERD, PTSD, RTC repair on each shoulder prior to this surgery.    Patient Stated Goals improve use of Rt shoulder    Currently in Pain? Yes   Pain Score 7    Pain Location --  anterior Rt shoulder    Pain Type Other (Comment)  pain reported to be similar to post op pain he has been experiencing.    Pain Radiating Towards none    Pain Onset 1 to 4 weeks ago   Pain Frequency Intermittent   Aggravating Factors  using his arm    Pain Relieving Factors ice/rest                          [facility] Adult PT Treatment/Exercise - 11/25/16 0001      Shoulder Exercises: Supine   External Rotation Right;AAROM;10 reps;Other (comment)  x10 sec hold at end range, x10 at 0 deg abd, x10 at 80 deg  Flexion AAROM;Both;15 reps   ABduction Right;15 reps;AAROM   Other Supine Exercises BLE serratus punches with 3# bar, x20 reps      Shoulder Exercises: Seated   Retraction Both;15 reps     Shoulder Exercises: Standing   Flexion AAROM;Right;10 reps;Other (comment)  hands clasped   Flexion Limitations elbow bent for eccentric portion   ABduction AAROM;10 reps   ABduction Limitations elbow bent for eccentric portion     Shoulder Exercises: Pulleys   Flexion 1 minute   ABduction 1 minute                PT Education - 11/25/16 1604    Education provided Yes   Education Details updates to HEP; alterations made to therex to decrease pain/discomfort   Person(s) Educated Patient   Methods Explanation;Demonstration;Handout;Verbal cues   Comprehension Verbalized understanding;Returned demonstration          PT Short Term Goals - 11/11/16 1539      PT SHORT TERM GOAL #1   Title pt will  demo consistency and independence with his HEP to improve shoulder ROM and strength    Time 2   Period Weeks   Status New     PT SHORT TERM GOAL #2   Title Pt will demo improved shoulder PROM to within 5 degrees of his LUE without report of pain, to allow progression to strengthening exercises.    Time 2   Period Weeks   Status New     PT SHORT TERM GOAL #3   Title Pt will demo Rt shoulder strength to atleast 3/5 MMT to allow him to dress and perform other self grooming tasks independently.    Time 4   Period Weeks   Status New           PT Long Term Goals - 11/11/16 1541      PT LONG TERM GOAL #1   Title Pt will demo improved shoulder AROM to within 5 degrees of his LUE, to allow him to perform light overhead activities such as chaning a light bulb or putting on his hat without difficulty.    Time 6   Period Weeks   Status New     PT LONG TERM GOAL #2   Title Pt will demo improved shoulder strength, ROM and power evident by his ability to report throwing a rope overhead atleast 35 ft and without more than 3/10 pain.    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    Time 8   Period Weeks   Status New               Plan - 11/25/16 1606    Clinical Impression Statement Pt arrived today reporting a fall last week resulting in increased soreness in his RUE. This has improved since the initial fall, and he demonstrated no signs of excessive discomfort/difficulty with his exercises during today's session. His ROM has improved, demonstrating near full AAROM with both flexion and abduction. He does continue to have limitations in shoulder ER, which should continue to improve with HEP adherence. No reports of increased pain following today's session. Will continue with current POC.   Rehab Potential Good   Clinical Impairments Affecting Rehab  Potential (+) motivated to return to work; (-) delayed getting  into rehab   PT Frequency 2x / week   PT Duration 8 weeks   PT Treatment/Interventions ADLs/Self Care Home Management;Cryotherapy;Moist Heat;Therapeutic exercise;Therapeutic activities;Functional mobility training;Neuromuscular re-education;Patient/family education;Manual techniques;Scar mobilization;Passive range of motion;Dry needling   PT Next Visit Plan Scapular mobilizations, scapular stabilization exercises: serratus punches, scap squeezes, AAROM in standing   PT Home Exercise Plan table slides for ROM: flexion/ABD/ER (against wall) 10x10 sec hold each; wall isometrics with elbow by side: flexion, extension, abd, IR (less than 50% effort) 10x5 sec hold, scap squeezes; standing AAROM flexion/abduction   Consulted and Agree with Plan of Care Patient      Patient will benefit from skilled therapeutic intervention in order to improve the following deficits and impairments:  Decreased activity tolerance, Decreased endurance, Decreased strength, Decreased scar mobility, Decreased range of motion, Increased fascial restricitons, Impaired flexibility, Impaired UE functional use, Pain, Increased muscle spasms, Hypomobility  Visit Diagnosis: Right shoulder pain, unspecified chronicity  Stiffness of right shoulder, not elsewhere classified  Muscle weakness (generalized)     Problem List Patient Active Problem List   Diagnosis Date Noted  . Complete tear of right rotator cuff 10/22/2016    Elly Modena 11/25/2016, 4:11 PM  [facility] [address] [place], [place], [address] Phone: [phone]   Fax:  [phone]  Name: MALKIEL FLAGG MRN: [id number] Date of Birth: 23-Apr-1951

## 2016-11-28 ENCOUNTER — Ambulatory Visit (HOSPITAL_COMMUNITY): Payer: No Typology Code available for payment source | Admitting: Physical Therapy

## 2016-12-01 ENCOUNTER — Ambulatory Visit (INDEPENDENT_AMBULATORY_CARE_PROVIDER_SITE_OTHER): Payer: Self-pay | Admitting: Orthopedic Surgery

## 2016-12-01 ENCOUNTER — Encounter (INDEPENDENT_AMBULATORY_CARE_PROVIDER_SITE_OTHER): Payer: Self-pay | Admitting: Orthopedic Surgery

## 2016-12-01 DIAGNOSIS — M75121 Complete rotator cuff tear or rupture of right shoulder, not specified as traumatic: Secondary | ICD-10-CM

## 2016-12-01 NOTE — Progress Notes (Signed)
   Post-Op Visit Note   Patient: Wesley Gregory           Date of Birth: 01/26/1951           MRN: [id number] Visit Date: 12/01/2016 PCP: Pcp Not In System   Assessment & Plan:  Chief Complaint:  Chief Complaint  Patient presents with  . Right Shoulder - Routine Post Op   Visit Diagnoses:  1. Complete tear of right rotator cuff     Plan: Wesley Gregory is a 66 year old patient 2 and half months out from right shoulder massive rotator cuff repair in general is doing well he's only been to therapy 3 or 4 times.  On exam he's Improving rotator cuff strength he doesn't yet have full forward flexion above 90 on his own but it is improving.  No course grinding or crepitus.  I'll him to continue with his therapy visits for 20 visits total.  I will see him back as needed.  I cautioned him against doing too much lifting out away from his body.  He has some left ankle arthritis and he wants to get an opinion about that.  Probably do that next year.  I'll have him see Dr. Sharol Given about that when he wants to consider either ankle fusion or ankle replacement  Follow-Up Instructions: No Follow-up on file.   Orders:  No orders of the defined types were placed in this encounter.  No orders of the defined types were placed in this encounter.   Imaging: No results found.  PMFS History: Patient Active Problem List   Diagnosis Date Noted  . Complete tear of right rotator cuff 10/22/2016   Past Medical History:  Diagnosis Date  . Anxiety   . Arthritis   . Depression   . DVT (deep venous thrombosis) ([place])   . Dyspnea    with injury  . GERD (gastroesophageal reflux disease)   . Head injury with loss of consciousness ([place])    short loss of consciouness, numerous head injuries  . History of kidney stones    Passed a couple  . PTSD (post-traumatic stress disorder)     No family history on file.  Past Surgical History:  Procedure Laterality Date  . ANKLE SURGERY Left    fracture repair- pins  .  CERVIX SURGERY  2009  . COLON RESECTION  1980's   abdominal injury  . COLONOSCOPY     x 2  . CYSTOSCOPY    . KNEE ARTHROSCOPY Bilateral   . MANDIBLE FRACTURE SURGERY    . SHOULDER ARTHROSCOPY     x 3 rotatoar cuff  . SHOULDER ARTHROSCOPY WITH ROTATOR CUFF REPAIR AND OPEN BICEPS TENODESIS Right 09/16/2016   Procedure: SHOULDER DIAGNOSTIC OPERATIVE ARTHROSCOPY WITH MINI-OPEN ROTATOR CUFF REPAIR AND  BICEPS TENODESIS.;  Surgeon: Meredith Pel, MD;  Location: [facility];  Service: Orthopedics;  Laterality: Right;   Social History   Occupational History  . Not on file.   Social History Main Topics  . Smoking status: Never Smoker  . Smokeless tobacco: Never Used  . Alcohol use No  . Drug use: No  . Sexual activity: Not on file

## 2016-12-09 ENCOUNTER — Ambulatory Visit (HOSPITAL_COMMUNITY): Payer: No Typology Code available for payment source | Attending: Orthopedic Surgery | Admitting: Physical Therapy

## 2016-12-09 DIAGNOSIS — M25511 Pain in right shoulder: Secondary | ICD-10-CM | POA: Diagnosis present

## 2016-12-09 DIAGNOSIS — M6281 Muscle weakness (generalized): Secondary | ICD-10-CM | POA: Diagnosis present

## 2016-12-09 DIAGNOSIS — M25611 Stiffness of right shoulder, not elsewhere classified: Secondary | ICD-10-CM | POA: Insufficient documentation

## 2016-12-09 NOTE — Therapy (Signed)
[facility], [place], [address] Phone: [phone]   Fax:  [phone]  Physical Therapy Treatment  Patient Details  Name: Wesley Gregory MRN: [id number] Date of Birth: 07-07-1951 Referring Provider: Alphonzo Severance, MD  Encounter Date: 12/09/2016      PT End of Session - 12/09/16 1617    Visit Number 4   Number of Visits 17   Date for PT Re-Evaluation 12/09/16   Authorization Type VA choice    Authorization Time Period 11/11/16 to 01/06/17   Authorization - Visit Number 4   Authorization - Number of Visits 20   PT Start Time [id number]   PT Stop Time [id number]   PT Time Calculation (min) 46 min   Activity Tolerance No increased pain;Patient tolerated treatment well   Behavior During Therapy [facility] for tasks assessed/performed      Past Medical History:  Diagnosis Date  . Anxiety   . Arthritis   . Depression   . DVT (deep venous thrombosis) ([place])   . Dyspnea    with injury  . GERD (gastroesophageal reflux disease)   . Head injury with loss of consciousness ([place])    short loss of consciouness, numerous head injuries  . History of kidney stones    Passed a couple  . PTSD (post-traumatic stress disorder)     Past Surgical History:  Procedure Laterality Date  . ANKLE SURGERY Left    fracture repair- pins  . CERVIX SURGERY  2009  . COLON RESECTION  1980's   abdominal injury  . COLONOSCOPY     x 2  . CYSTOSCOPY    . KNEE ARTHROSCOPY Bilateral   . MANDIBLE FRACTURE SURGERY    . SHOULDER ARTHROSCOPY     x 3 rotatoar cuff  . SHOULDER ARTHROSCOPY WITH ROTATOR CUFF REPAIR AND OPEN BICEPS TENODESIS Right 09/16/2016   Procedure: SHOULDER DIAGNOSTIC OPERATIVE ARTHROSCOPY WITH MINI-OPEN ROTATOR CUFF REPAIR AND  BICEPS TENODESIS.;  Surgeon: Meredith Pel, MD;  Location: [facility];  Service: Orthopedics;  Laterality: Right;    There were no vitals filed for this visit.      Subjective Assessment - 12/09/16 1540    Subjective  Pt states that he has been in class in [place] so he has not been exercising as much as he should be.    Pertinent History Anxiety, arthritis, GERD, PTSD, RTC repair on each shoulder prior to this surgery.    Patient Stated Goals improve use of Rt shoulder    Currently in Pain? Yes   Pain Score 3    Pain Location Shoulder   Pain Orientation Right   Pain Onset More than a month ago   Pain Frequency Intermittent                         [facility] Adult PT Treatment/Exercise - 12/09/16 0001      Exercises   Exercises Shoulder     Shoulder Exercises: Supine   Protraction Strengthening;Both  Rt hand with 1 #; Lt hand 3# x 10 reps    External Rotation AROM;Strengthening;Right;15 reps   Theraband Level (Shoulder External Rotation) --  elbow into side with 1#   Flexion AROM;Right;5 reps   Theraband Level (Shoulder Flexion) --  followed by PROM   ABduction AROM;Right;5 reps   Theraband Level (Shoulder ABduction) --  followed by PROM      Shoulder Exercises: Seated   Other Seated Exercises pulley for  flexion and abduction      Shoulder Exercises: Standing   Flexion AAROM;Right  wall walking    Other Standing Exercises isometric for flexion, abduction, IR, ER, and extension x 5 reps each      Shoulder Exercises: Pulleys   Flexion 1 minute   ABduction 1 minute     Manual Therapy   Manual Therapy Joint mobilization   Joint Mobilization scapular, inferior, posterior and anterior capsule   Passive ROM for flexion,abduction and ER                 PT Education - 12/09/16 1616    Education provided Yes   Education Details given for isometric as well as wall walk and wand exercises    Person(s) Educated Patient   Comprehension Verbalized understanding;Returned demonstration          PT Short Term Goals - 11/11/16 1539      PT SHORT TERM GOAL #1   Title pt will demo consistency and independence with his HEP to improve shoulder ROM and strength    Time 2    Period Weeks   Status New     PT SHORT TERM GOAL #2   Title Pt will demo improved shoulder PROM to within 5 degrees of his LUE without report of pain, to allow progression to strengthening exercises.    Time 2   Period Weeks   Status New     PT SHORT TERM GOAL #3   Title Pt will demo Rt shoulder strength to atleast 3/5 MMT to allow him to dress and perform other self grooming tasks independently.    Time 4   Period Weeks   Status New           PT Long Term Goals - 11/11/16 1541      PT LONG TERM GOAL #1   Title Pt will demo improved shoulder AROM to within 5 degrees of his LUE, to allow him to perform light overhead activities such as chaning a light bulb or putting on his hat without difficulty.    Time 6   Period Weeks   Status New     PT LONG TERM GOAL #2   Title Pt will demo improved shoulder strength, ROM and power evident by his ability to report throwing a rope overhead atleast 35 ft and without more than 3/10 pain.    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    Time 8   Period Weeks   Status New               Plan - 12/09/16 1617    Clinical Impression Statement Pt continues to have significant deficits in ROM.  Treatment focused on AA and PROM as well as isometrics to allow improved AROM.  Began both scapular and joint mobs with moderate restriction.     Rehab Potential Good   Clinical Impairments Affecting Rehab Potential (+) motivated to return to work; (-) delayed getting into rehab   PT Frequency 2x / week   PT Duration 8 weeks   PT Treatment/Interventions ADLs/Self Care Home Management;Cryotherapy;Moist Heat;Therapeutic exercise;Therapeutic activities;Functional mobility training;Neuromuscular re-education;Patient/family education;Manual techniques;Scar mobilization;Passive range of motion;Dry needling   PT Next  Visit Plan begin and give pt self jt mobiliztion for improved UE  mobility.  PROM !!   PT Home Exercise Plan table slides for ROM: flexion/ABD/ER (against wall) 10x10 sec hold each; wall isometrics with elbow by side: flexion, extension, abd, IR (less than 50% effort) 10x5 sec hold, scap squeezes; standing AAROM flexion/abduction   Consulted and Agree with Plan of Care Patient      Patient will benefit from skilled therapeutic intervention in order to improve the following deficits and impairments:  Decreased activity tolerance, Decreased endurance, Decreased strength, Decreased scar mobility, Decreased range of motion, Increased fascial restricitons, Impaired flexibility, Impaired UE functional use, Pain, Increased muscle spasms, Hypomobility  Visit Diagnosis: Right shoulder pain, unspecified chronicity  Stiffness of right shoulder, not elsewhere classified  Muscle weakness (generalized)     Problem List Patient Active Problem List   Diagnosis Date Noted  . Complete tear of right rotator cuff 10/22/2016    Rayetta Humphrey, PT CLT [phone] 12/09/2016, 4:20 PM  [facility] [address] [place], [place], [address] Phone: [phone]   Fax:  [phone]  Name: Wesley Gregory MRN: [id number] Date of Birth: December 22, 1950

## 2016-12-09 NOTE — Patient Instructions (Addendum)
ROM: Flexion - Wand (Supine)    Lie on back holding wand. Raise arms over head.  Repeat ___10_ times per set. Do _1___ sets per session. Do _2___ sessions per day.  http://orth.exer.us/928   Copyright  VHI. All rights reserved.  ROM: Abduction - Wand    Holding wand with right hand palm up, push wand directly out to side, leading with other hand palm down, until stretch is felt. Hold _5___ seconds. Repeat 10____ times per set. Do __1__ sets per session. Do ___2_ sessions per day.  http://orth.exer.us/746   Copyright  VHI. All rights reserved.  ROM: External / Internal Rotation - in Flexion (Standing)    With upper arms at your side, keep elbow bent at right angles and rotate up then down as far as possible without pain. Repeat 10____ times per set. Do __1__ sets per session. Do ___2_ sessions per day.  http://orth.exer.us/914   Copyright  VHI. All rights reserved.  Strengthening: Isometric Abduction    Using wall for resistance, press left arm into ball using light pressure. Hold _3-5___ seconds. Repeat __5-10__ times per set. Do 1____ sets per session. Do ____ sessions per day.2  http://orth.exer.us/806   Copyright  VHI. All rights reserved.  Strengthening: Isometric Extension    Using wall for resistance, press back of left arm into ball using light pressure. Hold _3-5___ seconds.2 Repeat ___5-10_ times per set. Do __1__ sets per session. Do ____ sessions per day.  http://orth.exer.us/804   Copyright  VHI. All rights reserved.  Strengthening: Isometric External Rotation    Using wall to provide resistance, and keeping right arm at side, press back of hand into ball using light pressure. Hold 3-5____ seconds. Repeat __10__ times per set. Do __1__ sets per session. Do __2__ sessions per day.  http://orth.exer.us/814   Copyright  VHI. All rights reserved.  Strengthening: Isometric Flexion    Using wall for resistance, press right fist into ball  using light pressure. Hold _3-5___ seconds. Repeat __10_ times per set. Do __1__ sets per session. Do __2__ sessions per day.  http://orth.exer.us/800   Copyright  VHI. All rights reserved.  Strengthening: Isometric Internal Rotation    Using door frame for resistance, press palm of right hand into ball using light pressure. Keep elbow in at side. Hold _3-5___ seconds. Repeat __5-10__ times per set. Do _1___ sets per session. Do _1___ sessions per day.  http://orth.exer.us/816   Copyright  VHI. All rights reserved.  ROM: Flexion (Standing)    Bring right arm straight out in front walk it up a wall as far as high as possible without pain. Keep palms facing up. Repeat _5-10___ times per set. Do ___1_ sets per session. Do __2__ sessions per day.  http://orth.exer.us/908   Copyright  VHI. All rights reserved.

## 2016-12-12 ENCOUNTER — Ambulatory Visit (HOSPITAL_COMMUNITY): Payer: No Typology Code available for payment source | Admitting: Physical Therapy

## 2016-12-16 ENCOUNTER — Ambulatory Visit (HOSPITAL_COMMUNITY): Payer: No Typology Code available for payment source

## 2016-12-16 ENCOUNTER — Encounter (HOSPITAL_COMMUNITY): Payer: Self-pay

## 2016-12-16 DIAGNOSIS — M25611 Stiffness of right shoulder, not elsewhere classified: Secondary | ICD-10-CM

## 2016-12-16 DIAGNOSIS — M25511 Pain in right shoulder: Secondary | ICD-10-CM

## 2016-12-16 DIAGNOSIS — M6281 Muscle weakness (generalized): Secondary | ICD-10-CM

## 2016-12-16 NOTE — Therapy (Signed)
[place], [place], [address] Phone: [phone]   Fax:  [phone]  Physical Therapy Treatment (reassessment)  Patient Details  Name: Wesley Gregory MRN: [id number] Date of Birth: [birth year] Referring Provider: Alphonzo Severance, MD  Encounter Date: 12/16/2016      PT End of Session - 12/16/16 1521    Visit Number 5   Number of Visits 17   Date for PT Re-Evaluation 12/09/16   Authorization Type [facility] choice    Authorization Time Period 11/11/16 to 01/06/17   Authorization - Visit Number 5   Authorization - Number of Visits 20   PT Start Time 2376   PT Stop Time 1559   PT Time Calculation (min) 43 min   Activity Tolerance No increased pain;Patient tolerated treatment well   Behavior During Therapy [facility] for tasks assessed/performed      Past Medical History:  Diagnosis Date  . Anxiety   . Arthritis   . Depression   . DVT (deep venous thrombosis) ([place])   . Dyspnea    with injury  . GERD (gastroesophageal reflux disease)   . Head injury with loss of consciousness ([place])    short loss of consciouness, numerous head injuries  . History of kidney stones    Passed a couple  . PTSD (post-traumatic stress disorder)     Past Surgical History:  Procedure Laterality Date  . ANKLE SURGERY Left    fracture repair- pins  . CERVIX SURGERY  2009  . COLON RESECTION  1980's   abdominal injury  . COLONOSCOPY     x 2  . CYSTOSCOPY    . KNEE ARTHROSCOPY Bilateral   . MANDIBLE FRACTURE SURGERY    . SHOULDER ARTHROSCOPY     x 3 rotatoar cuff  . SHOULDER ARTHROSCOPY WITH ROTATOR CUFF REPAIR AND OPEN BICEPS TENODESIS Right 09/16/2016   Procedure: SHOULDER DIAGNOSTIC OPERATIVE ARTHROSCOPY WITH MINI-OPEN ROTATOR CUFF REPAIR AND  BICEPS TENODESIS.;  Surgeon: Meredith Pel, MD;  Location: [facility];  Service: Orthopedics;  Laterality: Right;    There were no vitals filed for this visit.      Subjective Assessment - 12/16/16  1520    Subjective Pt states that he feels good this date. 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 He feels like he is progressing well.   Pertinent History Anxiety, arthritis, GERD, PTSD, RTC repair on each shoulder prior to this surgery.    Patient Stated Goals improve use of Rt shoulder    Currently in Pain? No/denies   Pain Onset More than a month ago            [facility] PT Assessment - 12/16/16 0001      ROM / Strength   AROM / PROM / Strength AROM     AROM   Right Shoulder Flexion 57 Degrees  sitting   Right Shoulder ABduction 46 Degrees  sitting   Right Shoulder External Rotation 36 Degrees  sitting   Left Shoulder Flexion 144 Degrees  sitting   Left Shoulder ABduction 123 Degrees  sitting   Left Shoulder External Rotation 60 Degrees  sitting     PROM   PROM Assessment Site Shoulder   Right/Left Shoulder Right   Right Shoulder Flexion 155 Degrees   Right Shoulder ABduction 100 Degrees   Right Shoulder External Rotation 39 Degrees  at 30 deg abd, 41 deg at 70 deg abd  [facility] Adult PT Treatment/Exercise - 12/16/16 0001      Shoulder Exercises: Sidelying   External Rotation AROM  2 sets of 8 reps   Flexion AROM  2 sets x 8 reps   ABduction AROM;Right  2 sets of 8 reps     Shoulder Exercises: Standing   Flexion AAROM;Right  wall walking x 8 reps                PT Education - 12/16/16 1603    Education provided Yes   Education Details reassessment findings, POC, continue HEP   Person(s) Educated Patient   Methods Explanation;Demonstration   Comprehension Verbalized understanding          PT Short Term Goals - 12/16/16 1541      PT SHORT TERM GOAL #1   Title pt will demo consistency and independence with his HEP to improve shoulder ROM and strength    Time 2   Period Weeks   Status On-going     PT SHORT TERM GOAL #2   Title Pt will demo improved shoulder  PROM to within 5 degrees of his LUE without report of pain, to allow progression to strengthening exercises.    Time 2   Period Weeks   Status On-going     PT SHORT TERM GOAL #3   Title Pt will demo Rt shoulder strength to atleast 3/5 MMT to allow him to dress and perform other self grooming tasks independently.    Baseline unable to reach [facility]   Time 4   Period Weeks   Status On-going           PT Long Term Goals - 12/16/16 1543      PT LONG TERM GOAL #1   Title Pt will demo improved shoulder AROM to within 5 degrees of his LUE, to allow him to perform light overhead activities such as chaning a light bulb or putting on his hat without difficulty.    Time 6   Period Weeks   Status On-going     PT LONG TERM GOAL #2   Title Pt will demo improved shoulder strength, ROM and power evident by his ability to report throwing a rope overhead atleast 35 ft and without more than 3/10 pain.    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    Time 8   Period Weeks   Status On-going               Plan - 12/16/16 1604    Clinical Impression Statement PT reassessed pt's goals and outcome measures this date. Pt has made improvements in flexion and abd PROM, however, his strength is still significantly impaired as he is unable to raise his RUE to shoulder level. Pt has not met any of his goals as of yet, and PT educated pt on importance of performing HEP as instructed. Pt verbalized that he feels he is 40% improved since beginning therapy, attributing the remaining 60% to lack of ROM and decreased strength. 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   Rehab Potential Good   Clinical Impairments Affecting Rehab Potential (+) motivated to return to work; (-) delayed getting into rehab   PT Frequency 2x /  week  PT Duration 8 weeks   PT Treatment/Interventions ADLs/Self Care Home Management;Cryotherapy;Moist Heat;Therapeutic exercise;Therapeutic activities;Functional mobility training;Neuromuscular re-education;Patient/family education;Manual techniques;Scar mobilization;Passive range of motion;Dry needling   PT Next Visit Plan begin and give pt self jt mobiliztion for improved UE mobility.     PT Home Exercise Plan table slides for ROM: flexion/ABD/ER (against wall) 10x10 sec hold each; wall isometrics with elbow by side: flexion, extension, abd, IR (less than 50% effort) 10x5 sec hold, scap squeezes; standing AAROM flexion/abduction   Consulted and Agree with Plan of Care Patient      Patient will benefit from skilled therapeutic intervention in order to improve the following deficits and impairments:  Decreased activity tolerance, Decreased endurance, Decreased strength, Decreased scar mobility, Decreased range of motion, Increased fascial restricitons, Impaired flexibility, Impaired UE functional use, Pain, Increased muscle spasms, Hypomobility  Visit Diagnosis: Right shoulder pain, unspecified chronicity  Stiffness of right shoulder, not elsewhere classified  Muscle weakness (generalized)       G-Codes - 01/06/17 1609    Functional Limitation Carrying, moving and handling objects   Carrying, Moving and Handling Objects Current Status (P5361) At least 40 percent but less than 60 percent impaired, limited or restricted   Carrying, Moving and Handling Objects Goal Status (W4315) At least 20 percent but less than 40 percent impaired, limited or restricted      Problem List Patient Active Problem List   Diagnosis Date Noted  . Complete tear of right rotator cuff 10/22/2016    Geraldine Solar PT, DPT   [place] [address] [place], [place], [address] Phone: [phone]   Fax:  [phone]  Name: Wesley Gregory MRN: [id number] Date  of Birth: [birth year]

## 2016-12-19 ENCOUNTER — Ambulatory Visit (HOSPITAL_COMMUNITY): Payer: No Typology Code available for payment source | Admitting: Physical Therapy

## 2016-12-23 ENCOUNTER — Ambulatory Visit (HOSPITAL_COMMUNITY): Payer: No Typology Code available for payment source | Admitting: Physical Therapy

## 2016-12-23 DIAGNOSIS — M25511 Pain in right shoulder: Secondary | ICD-10-CM | POA: Diagnosis not present

## 2016-12-23 DIAGNOSIS — M25611 Stiffness of right shoulder, not elsewhere classified: Secondary | ICD-10-CM

## 2016-12-23 DIAGNOSIS — M6281 Muscle weakness (generalized): Secondary | ICD-10-CM

## 2016-12-23 NOTE — Therapy (Addendum)
[place], [place], [address] Phone: [phone]   Fax:  [phone]  Physical Therapy Treatment/Discharge  Patient Details  Name: Wesley Gregory MRN: [id number] Date of Birth: August 30, 1951 Referring Provider: Alphonzo Severance, MD  Encounter Date: 12/23/2016      PT End of Session - 12/23/16 1538    Visit Number 6   Number of Visits 17   Date for PT Re-Evaluation 12/09/16   Authorization Type VA choice    Authorization Time Period 11/11/16 to 01/06/17   Authorization - Visit Number 6   Authorization - Number of Visits 20   PT Start Time 9371   PT Stop Time 6967  Pt requesting to leave early for meeting   PT Time Calculation (min) 23 min   Activity Tolerance No increased pain;Patient tolerated treatment well   Behavior During Therapy [facility] for tasks assessed/performed      Past Medical History:  Diagnosis Date  . Anxiety   . Arthritis   . Depression   . DVT (deep venous thrombosis) ([place])   . Dyspnea    with injury  . GERD (gastroesophageal reflux disease)   . Head injury with loss of consciousness ([place])    short loss of consciouness, numerous head injuries  . History of kidney stones    Passed a couple  . PTSD (post-traumatic stress disorder)     Past Surgical History:  Procedure Laterality Date  . ANKLE SURGERY Left    fracture repair- pins  . CERVIX SURGERY  2009  . COLON RESECTION  1980's   abdominal injury  . COLONOSCOPY     x 2  . CYSTOSCOPY    . KNEE ARTHROSCOPY Bilateral   . MANDIBLE FRACTURE SURGERY    . SHOULDER ARTHROSCOPY     x 3 rotatoar cuff  . SHOULDER ARTHROSCOPY WITH ROTATOR CUFF REPAIR AND OPEN BICEPS TENODESIS Right 09/16/2016   Procedure: SHOULDER DIAGNOSTIC OPERATIVE ARTHROSCOPY WITH MINI-OPEN ROTATOR CUFF REPAIR AND  BICEPS TENODESIS.;  Surgeon: Meredith Pel, MD;  Location: [facility];  Service: Orthopedics;  Laterality: Right;    There were no vitals filed for this visit.       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 He does feel that it continues to improve though.   Pertinent History Anxiety, arthritis, GERD, PTSD, RTC repair on each shoulder prior to this surgery.    Patient Stated Goals improve use of Rt shoulder    Currently in Pain? No/denies   Pain Onset More than a month ago                         [facility] Adult PT Treatment/Exercise - 12/23/16 0001      Shoulder Exercises: Seated   Flexion Both;15 reps   Flexion Limitations AAROM      Shoulder Exercises: Sidelying   Flexion Right;Other (comment);AROM;15 reps  elbow straight during elevation, elbow bent to bring back    Theraband Level (Shoulder Flexion) Other (comment)  x2 sets    ABduction Right;15 reps;AROM;Other (comment)  elbow straight   ABduction Limitations x2 sets      Shoulder Exercises: Standing   External Rotation AAROM;10 reps   External Rotation Limitations 10 sec hold at end range    Flexion 20 reps;Right;AAROM   Flexion Limitations against wall  PT Education - 12/23/16 1536    Education provided Yes   Education Details importance of HEP adherence and updating next session; technique with therex    Person(s) Educated Patient   Methods Explanation;Demonstration;Verbal cues   Comprehension Verbalized understanding;Returned demonstration          PT Short Term Goals - 12/16/16 1541      PT SHORT TERM GOAL #1   Title pt will demo consistency and independence with his HEP to improve shoulder ROM and strength    Time 2   Period Weeks   Status On-going     PT SHORT TERM GOAL #2   Title Pt will demo improved shoulder PROM to within 5 degrees of his LUE without report of pain, to allow progression to strengthening exercises.    Time 2   Period Weeks   Status On-going     PT SHORT TERM GOAL #3   Title Pt will demo Rt shoulder  strength to atleast 3/5 MMT to allow him to dress and perform other self grooming tasks independently.    Baseline unable to reach [facility]   Time 4   Period Weeks   Status On-going           PT Long Term Goals - 12/16/16 1543      PT LONG TERM GOAL #1   Title Pt will demo improved shoulder AROM to within 5 degrees of his LUE, to allow him to perform light overhead activities such as chaning a light bulb or putting on his hat without difficulty.    Time 6   Period Weeks   Status On-going     PT LONG TERM GOAL #2   Title Pt will demo improved shoulder strength, ROM and power evident by his ability to report throwing a rope overhead atleast 35 ft and without more than 3/10 pain.    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    Time 8   Period Weeks   Status On-going               Plan - 12/23/16 1539    Clinical Impression Statement Pt arrived today requesting to leave early due to issues at work. Session focused mainly on active assisted and active ROM shoulder activity. Pt demonstrating limitations in gravity dependent shoulder elevation however this improved greatly when positioned in sidelying. Pt does require direction for proper technique initially but this improves after several repetitions. Due to time constraints, his HEP was not updated this visit but he would benefit from an update at his next session to ensure he is using proper technique and focused specifically on current limitations. Ended session without concerns or increase in pain at this time.    Rehab Potential Good   Clinical Impairments Affecting Rehab Potential (+) motivated to return to work; (-) delayed getting into rehab   PT Frequency 2x / week   PT Duration 8 weeks   PT Treatment/Interventions ADLs/Self Care Home Management;Cryotherapy;Moist Heat;Therapeutic  exercise;Therapeutic activities;Functional mobility training;Neuromuscular re-education;Patient/family education;Manual techniques;Scar mobilization;Passive range of motion;Dry needling   PT Next Visit Plan update HEP to include gravity eliminated AROM, AAROM if necessary, self joint mobs/stretch to increase shoulder ER at 30 deg and 90 deg    PT Home Exercise Plan table slides for  ROM: flexion/ABD/ER (against wall) 10x10 sec hold each; wall isometrics with elbow by side: flexion, extension, abd, IR (less than 50% effort) 10x5 sec hold, scap squeezes; standing AAROM flexion/abduction   Consulted and Agree with Plan of Care Patient      Patient will benefit from skilled therapeutic intervention in order to improve the following deficits and impairments:  Decreased activity tolerance, Decreased endurance, Decreased strength, Decreased scar mobility, Decreased range of motion, Increased fascial restricitons, Impaired flexibility, Impaired UE functional use, Pain, Increased muscle spasms, Hypomobility  Visit Diagnosis: Right shoulder pain, unspecified chronicity  Stiffness of right shoulder, not elsewhere classified  Muscle weakness (generalized)     Problem List Patient Active Problem List   Diagnosis Date Noted  . Complete tear of right rotator cuff 10/22/2016   3:45 PM,12/23/16 Elly Modena PT, DPT [name] [place] [address] [place], [place], [address] Phone: [phone]   Fax:  [phone]  Name: Wesley Gregory MRN: [id number] Date of Birth: 06-16-1951  *Addendum to close episode of care and d/c pt.   PHYSICAL THERAPY DISCHARGE SUMMARY  Visits from Start of Care: 6  Current functional level related to goals / functional outcomes: See above for more details   Remaining deficits: See above for more details   Education / Equipment: See above  Plan: Patient agrees to discharge.   Patient goals were partially met. Patient is being discharged due to the patient's request.  ?????    *Pt called on 12/30/16 requesting all remaining appointments be cancelled due to a busy schedule and inability to come to his appointments. He was discharged from skilled PT per his request.    3:50 PM,01/19/17 Elly Modena PT, [facility] [phone]

## 2016-12-26 ENCOUNTER — Ambulatory Visit (HOSPITAL_COMMUNITY): Payer: No Typology Code available for payment source | Admitting: Physical Therapy

## 2016-12-30 ENCOUNTER — Telehealth (HOSPITAL_COMMUNITY): Payer: Self-pay

## 2016-12-30 ENCOUNTER — Ambulatory Visit (HOSPITAL_COMMUNITY): Payer: No Typology Code available for payment source

## 2016-12-30 NOTE — Telephone Encounter (Signed)
12/30/16 left him a message to ask if he wanted to be discharged from therapy and to call back and let us know

## 2016-12-30 NOTE — Telephone Encounter (Signed)
12/30/16 pt left a message to cx all future appts.  Due to his busy schedule

## 2017-01-02 ENCOUNTER — Encounter (HOSPITAL_COMMUNITY): Payer: No Typology Code available for payment source | Admitting: Physical Therapy

## 2017-01-06 ENCOUNTER — Encounter (HOSPITAL_COMMUNITY): Payer: No Typology Code available for payment source | Admitting: Physical Therapy

## 2017-01-13 ENCOUNTER — Encounter (HOSPITAL_COMMUNITY): Payer: No Typology Code available for payment source | Admitting: Physical Therapy

## 2017-01-13 ENCOUNTER — Encounter (HOSPITAL_COMMUNITY): Payer: No Typology Code available for payment source

## 2017-01-20 ENCOUNTER — Encounter (HOSPITAL_COMMUNITY): Payer: No Typology Code available for payment source | Admitting: Physical Therapy

## 2017-01-20 ENCOUNTER — Encounter (HOSPITAL_COMMUNITY): Payer: No Typology Code available for payment source

## 2017-01-27 ENCOUNTER — Encounter (HOSPITAL_COMMUNITY): Payer: No Typology Code available for payment source | Admitting: Physical Therapy

## 2018-06-19 ENCOUNTER — Emergency Department (HOSPITAL_COMMUNITY): Payer: No Typology Code available for payment source

## 2018-06-19 ENCOUNTER — Emergency Department (HOSPITAL_COMMUNITY)
Admission: EM | Admit: 2018-06-19 | Discharge: 2018-06-19 | Disposition: A | Discharge status: Home / Self Care | Payer: No Typology Code available for payment source | Attending: Emergency Medicine | Admitting: Emergency Medicine

## 2018-06-19 ENCOUNTER — Other Ambulatory Visit: Payer: Self-pay

## 2018-06-19 ENCOUNTER — Encounter (HOSPITAL_COMMUNITY): Payer: Self-pay

## 2018-06-19 DIAGNOSIS — Z79899 Other long term (current) drug therapy: Secondary | ICD-10-CM | POA: Insufficient documentation

## 2018-06-19 DIAGNOSIS — E876 Hypokalemia: Secondary | ICD-10-CM | POA: Insufficient documentation

## 2018-06-19 DIAGNOSIS — T1490XA Injury, unspecified, initial encounter: Secondary | ICD-10-CM

## 2018-06-19 DIAGNOSIS — Y9352 Activity, horseback riding: Secondary | ICD-10-CM | POA: Insufficient documentation

## 2018-06-19 DIAGNOSIS — Z7982 Long term (current) use of aspirin: Secondary | ICD-10-CM | POA: Diagnosis not present

## 2018-06-19 DIAGNOSIS — R51 Headache: Secondary | ICD-10-CM | POA: Diagnosis present

## 2018-06-19 DIAGNOSIS — Y999 Unspecified external cause status: Secondary | ICD-10-CM | POA: Insufficient documentation

## 2018-06-19 DIAGNOSIS — Y929 Unspecified place or not applicable: Secondary | ICD-10-CM | POA: Diagnosis not present

## 2018-06-19 DIAGNOSIS — S2242XA Multiple fractures of ribs, left side, initial encounter for closed fracture: Secondary | ICD-10-CM | POA: Insufficient documentation

## 2018-06-19 LAB — I-STAT CG4 LACTIC ACID, ED: Lactic Acid, Venous: 1.56 mmol/L (ref 0.5–1.9)

## 2018-06-19 LAB — COMPREHENSIVE METABOLIC PANEL
ALT: 26 U/L (ref 0–44)
ANION GAP: 10 (ref 5–15)
AST: 32 U/L (ref 15–41)
Albumin: 4.2 g/dL (ref 3.5–5.0)
Alkaline Phosphatase: 56 U/L (ref 38–126)
BUN: 21 mg/dL (ref 8–23)
CHLORIDE: 104 mmol/L (ref 98–111)
CO2: 26 mmol/L (ref 22–32)
Calcium: 9.3 mg/dL (ref 8.9–10.3)
Creatinine, Ser: 1.1 mg/dL (ref 0.61–1.24)
GFR calc non Af Amer: >60 mL/min (ref >60)
Glucose, Bld: 110 mg/dL — ABNORMAL HIGH (ref 70–99)
Potassium: 3.3 mmol/L — ABNORMAL LOW (ref 3.5–5.1)
SODIUM: 140 mmol/L (ref 135–145)
Total Bilirubin: 0.9 mg/dL (ref 0.3–1.2)
Total Protein: 8 g/dL (ref 6.5–8.1)

## 2018-06-19 LAB — I-STAT CHEM 8, ED
BUN: 25 mg/dL — ABNORMAL HIGH (ref 8–23)
CALCIUM ION: 1.07 mmol/L — AB (ref 1.15–1.40)
Chloride: 103 mmol/L (ref 98–111)
Creatinine, Ser: 1 mg/dL (ref 0.61–1.24)
GLUCOSE: 107 mg/dL — AB (ref 70–99)
HCT: 43 % (ref 39.0–52.0)
HEMOGLOBIN: 14.6 g/dL (ref 13.0–17.0)
Potassium: 3.3 mmol/L — ABNORMAL LOW (ref 3.5–5.1)
Sodium: 143 mmol/L (ref 135–145)
TCO2: 28 mmol/L (ref 22–32)

## 2018-06-19 LAB — CBC
HCT: 44.6 % (ref 39.0–52.0)
HEMOGLOBIN: 15.3 g/dL (ref 13.0–17.0)
MCH: 30.4 pg (ref 26.0–34.0)
MCHC: 34.3 g/dL (ref 30.0–36.0)
MCV: 88.5 fL (ref 78.0–100.0)
Platelets: 206 10*3/uL (ref 150–400)
RBC: 5.04 MIL/uL (ref 4.22–5.81)
RDW: 12.3 % (ref 11.5–15.5)
WBC: 13.6 10*3/uL — ABNORMAL HIGH (ref 4.0–10.5)

## 2018-06-19 LAB — URINALYSIS, ROUTINE W REFLEX MICROSCOPIC
BILIRUBIN URINE: NEGATIVE
Bacteria, UA: NONE SEEN
Glucose, UA: NEGATIVE mg/dL
KETONES UR: NEGATIVE mg/dL
LEUKOCYTES UA: NEGATIVE
NITRITE: NEGATIVE
PH: 5 (ref 5.0–8.0)
Protein, ur: NEGATIVE mg/dL
RBC / HPF: >50 RBC/hpf — ABNORMAL HIGH (ref 0–5)
SPECIFIC GRAVITY, URINE: 1.044 — AB (ref 1.005–1.030)

## 2018-06-19 LAB — PROTIME-INR
INR: 1.08
PROTHROMBIN TIME: 13.9 s (ref 11.4–15.2)

## 2018-06-19 LAB — CDS SEROLOGY

## 2018-06-19 LAB — ETHANOL

## 2018-06-19 LAB — SAMPLE TO BLOOD BANK

## 2018-06-19 MED ORDER — OXYCODONE-ACETAMINOPHEN 5-325 MG PO TABS: 1.0000 | ORAL_TABLET | ORAL | 0 refills | Status: DC | PRN

## 2018-06-19 MED ORDER — IOPAMIDOL (ISOVUE-300) INJECTION 61%
100.0000 mL | Freq: Once | INTRAVENOUS | Status: AC | PRN
  Administered 2018-06-19: 100 mL via INTRAVENOUS

## 2018-06-19 NOTE — ED Triage Notes (Signed)
Pt was thrown from a horse about 6 ft and c/o of chest and rib pain pt given 4 mg morphine to help with pain

## 2018-06-19 NOTE — ED Notes (Signed)
Pt aware of need for urine  

## 2018-06-19 NOTE — Discharge Instructions (Signed)
4 of your ribs are fractured on the left.  Please use incentive spirometer as instructed.  Use pain medicine as needed.  Eat potassium rich foods and be rechecked for your potassium level this week.  Return if you become more short of breath, lightheaded, or feel worse in any way.

## 2018-06-19 NOTE — ED Provider Notes (Addendum)
[facility] Provider Note   CSN: [id number] Arrival date & time: 06/19/18  1450     History   Chief Complaint Chief Complaint  Patient presents with  . Fall    HPI [facility] is a 66 y.o. male.  HPI 67 year old male transported here via EMS from rocking him [place] after a fall from a horse.  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  He states that the horse bucked him off.  He landed on his head.  He is having pain in his head, neck, and chest.  He has not been upper ambulatory since the accident.  EMS was called and he was transported.  He received morphine prehospital.  Is chiefly complaining of pain in the left lower chest but also some radiating across his chest.  He has some headache and neck pain.  He denies any numbness, tingling, or weakness beyond baseline.  He states he has had a stroke in the past.  He has had some ongoing left hip pain that has continued to bother him but he has not felt there is any change in this.  He denies being on blood thinners, alcohol use or drug use. Past Medical History:  Diagnosis Date  . Anxiety   . Arthritis   . Depression   . DVT (deep venous thrombosis) ([place])   . Dyspnea    with injury  . GERD (gastroesophageal reflux disease)   . Head injury with loss of consciousness ([place])    short loss of consciouness, numerous head injuries  . History of kidney stones    Passed a couple  . PTSD (post-traumatic stress disorder)     Patient Active Problem List   Diagnosis Date Noted  . Complete tear of right rotator cuff 10/22/2016    Past Surgical History:  Procedure Laterality Date  . ANKLE SURGERY Left    fracture repair- pins  . CERVIX SURGERY  2009  . COLON RESECTION  1980's   abdominal injury  . COLONOSCOPY     x 2  . CYSTOSCOPY    . KNEE ARTHROSCOPY Bilateral   . MANDIBLE FRACTURE SURGERY    . SHOULDER ARTHROSCOPY     x 3 rotatoar cuff  . SHOULDER ARTHROSCOPY WITH ROTATOR CUFF REPAIR  AND OPEN BICEPS TENODESIS Right 09/16/2016   Procedure: SHOULDER DIAGNOSTIC OPERATIVE ARTHROSCOPY WITH MINI-OPEN ROTATOR CUFF REPAIR AND  BICEPS TENODESIS.;  Surgeon: Meredith Pel, MD;  Location: [facility];  Service: Orthopedics;  Laterality: Right;        Home Medications    Prior to Admission medications   Medication Sig Start Date End Date Taking? Authorizing Provider  aspirin EC 81 MG tablet Take 81 mg by mouth daily.   Yes [provider]  ATORVASTATIN CALCIUM PO Take 0.5 mg by mouth at bedtime.   Yes [provider]  meloxicam (MOBIC) 15 MG tablet Take 15 mg by mouth daily.   Yes [provider]  naproxen sodium (ALEVE) 220 MG tablet Take 440 mg by mouth daily as needed (pain).   Yes [provider]  omeprazole (PRILOSEC) 40 MG capsule Take 40 mg by mouth daily.   Yes [provider]    Family History History reviewed. No pertinent family history.  Social History Social History   Tobacco Use  . Smoking status: Never Smoker  . Smokeless tobacco: Never Used  Substance Use Topics  . Alcohol use: No  . Drug use:  No     Allergies   Sulfa antibiotics   Review of Systems Review of Systems  All other systems reviewed and are negative.    Physical Exam Updated Vital Signs BP (!) 137/95 (BP Location: Left Arm)   Pulse 82   Temp 97.9 F (36.6 C) (Oral)   Resp 16   Ht 1.956 m (6\' 5" )   Wt 93 kg   SpO2 94%   BMI 24.31 kg/m   Physical Exam  Constitutional: He is oriented to person, place, and time. He appears well-developed and well-nourished.  HENT:  Head: Normocephalic and atraumatic.  Right Ear: External ear normal.  Left Ear: External ear normal.  Nose: Nose normal.  Mouth/Throat: Oropharynx is clear and moist.  Eyes: Pupils are equal, round, and reactive to light. EOM are normal.  Neck:  Patient with diffuse posterior cervical tenderness to palpation. Cervical collar applied during exam Trachea is midline  no external signs of trauma are noted on his neck exam No JVD is noted.  Cardiovascular: Normal rate, regular rhythm, normal heart sounds and intact distal pulses.  Pulmonary/Chest: Effort normal and breath sounds normal.  Abdominal: Soft. Bowel sounds are normal.  Musculoskeletal:  Pelvis is stable to palpation No obvious bony deformity is noted Patient is able to move his shoulders, elbows, wrists, hips, knees, ankles are full active range of motion. Back is examined and no external signs of trauma are noted Thoracic and lumbar spine are palpated no point tenderness noted  Neurological: He is alert and oriented to person, place, and time. He displays normal reflexes. No cranial nerve deficit or sensory deficit. He exhibits normal muscle tone. Coordination normal.  Skin: Skin is warm and dry. Capillary refill takes less than 2 seconds.  Psychiatric: He has a normal mood and affect.  Nursing note and vitals reviewed.    ED Treatments / Results  Labs (all labs ordered are listed, but only abnormal results are displayed) Labs Reviewed - No data to display  EKG EKG Interpretation  Date/Time:  Saturday June 19 2018 15:04:14 EDT Ventricular Rate:  78 PR Interval:    QRS Duration: 110 QT Interval:  399 QTC Calculation: 455 R Axis:   17 Text Interpretation:  Sinus rhythm RSR' in V1 or V2, right VCD or RVH Baseline wander in lead(s) V6 Confirmed by Pattricia Boss [phone]) on 06/19/2018 4:23:45 PM   Radiology Dg Chest 1 View  Result Date: 06/19/2018 CLINICAL DATA:  Acute chest pain following fall today. Initial encounter. EXAM: CHEST  1 VIEW COMPARISON:  None. FINDINGS: Fractures of the RIGHT 6th rib and LEFT 3rd rib are noted. The cardiomediastinal silhouette is unremarkable. There is no evidence of focal airspace disease, pulmonary edema, suspicious pulmonary nodule/mass, pleural effusion, or pneumothorax. LOWER cervical spine fusion hardware noted. IMPRESSION: RIGHT 6th rib and LEFT  3rd rib fractures of uncertain chronicity-correlate with pain. No pleural effusion or pneumothorax. Electronically Signed   By: Margarette Canada M.D.   On: 06/19/2018 16:17   Ct Head Wo Contrast  Result Date: 06/19/2018 CLINICAL DATA:  Pain post being thrown from a horse. EXAM: CT HEAD WITHOUT CONTRAST CT CERVICAL SPINE WITHOUT CONTRAST TECHNIQUE: Multidetector CT imaging of the head and cervical spine was performed following the standard protocol without intravenous contrast. Multiplanar CT image reconstructions of the cervical spine were also generated. COMPARISON:  None. FINDINGS: CT HEAD FINDINGS Brain: No evidence of acute infarction, hemorrhage, hydrocephalus, extra-axial collection or mass lesion/mass effect. Nonspecific area of encephalomalacia in the left  frontal lobe, in the region of the operculum. Vascular: Calcific atherosclerotic disease of the intra cavernous carotid arteries. Skull: Normal. Negative for fracture or focal lesion. Sinuses/Orbits: No acute finding. Other: None. CT CERVICAL SPINE FINDINGS Alignment: Reversal of cervical lordosis. Skull base and vertebrae: No acute fracture. No primary bone lesion or focal pathologic process. Soft tissues and spinal canal: No prevertebral fluid or swelling. No visible canal hematoma. Prior C6-C7 anterior fusion. Disc levels:  Multilevel osteoarthritic changes. Upper chest: Thickening of the left apical pleura. Other: None. IMPRESSION: No acute intracranial abnormality. Area of encephalomalacia in the left frontal lobe, nonspecific. This may be due to prior small ischemic infarction or remote trauma. No evidence of acute traumatic injury to the cervical spine. Reversal of the cervical lordosis and multilevel osteoarthritic changes. Intact C6-C7 anterior spinal fusion. Electronically Signed   By: Fidela Salisbury M.D.   On: 06/19/2018 17:56   Ct Chest W Contrast  Result Date: 06/19/2018 CLINICAL DATA:  67 y/o M; thrown from a horse with diffuse pain  from head to legs. EXAM: CT CHEST, ABDOMEN, AND PELVIS WITH CONTRAST TECHNIQUE: Multidetector CT imaging of the chest, abdomen and pelvis was performed following the standard protocol during bolus administration of intravenous contrast. Multidetector CT imaging of the thoracic spine was performed without intravenous contrast administration. Multiplanar CT image reconstructions were also generated. Multidetector CT imaging of the lumbar spine was performed without intravenous contrast administration. Multiplanar CT image reconstructions were also generated. CONTRAST:  177mL ISOVUE-300 IOPAMIDOL (ISOVUE-300) INJECTION 61% COMPARISON:  None. FINDINGS: CT CHEST FINDINGS Cardiovascular: No significant vascular findings. Normal heart size. No pericardial effusion. Mediastinum/Nodes: No enlarged mediastinal, hilar, or axillary lymph nodes. Thyroid gland, trachea, and esophagus demonstrate no significant findings. Lungs/Pleura: Lungs are clear. No pleural effusion or pneumothorax. Musculoskeletal: Left rib 5-8 minimally displaced acute fractures of the rib necks. Chronic right rib fracture deformity. CT ABDOMEN PELVIS FINDINGS Hepatobiliary: No hepatic injury or perihepatic hematoma. Gallbladder is unremarkable Pancreas: Unremarkable. No pancreatic ductal dilatation or surrounding inflammatory changes. Spleen: No splenic injury or perisplenic hematoma. Adrenals/Urinary Tract: No adrenal hemorrhage or renal injury identified. Bladder is unremarkable. Stomach/Bowel: Stomach is within normal limits. Appendix appears normal. 36 mm diverticulum arising from the third segment of the duodenum without inflammatory changes. No evidence of bowel wall thickening, distention, or inflammatory changes. Vascular/Lymphatic: Aortic atherosclerosis. No enlarged abdominal or pelvic lymph nodes. Reproductive: Ovoid soft tissue structure within the right lower inguinal canal (series 1, image 132) with soft tissue attenuation, possibly  undescended testicle. Inguinal canal and scrotal ultrasound is recommended. Other: No abdominal wall hernia or abnormality. No abdominopelvic ascites. Musculoskeletal: No fracture is seen. CT THORACIC SPINE FINDINGS Segmentation: 5 lumbar type vertebrae. Alignment: Normal. Vertebrae: No acute fracture or focal pathologic process. Paraspinal and other soft tissues: As above. Disc levels: Intervertebral disc spaces are maintained. CT LUMBAR SPINE FINDINGS Segmentation: 5 lumbar type vertebrae. Alignment: Normal. Vertebrae: No acute fracture or focal pathologic process. 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 Disc levels: Mild disc and facet degenerative changes of the lumbar spine. IMPRESSION: 1. Left rib 5-8 minimally displaced acute posterior fractures of the rib necks. 2. No additional fracture or internal injury of the chest, abdomen, pelvis, thoracic spine, or lumbar spine identified. Electronically Signed   By: Kristine Garbe M.D.   On: 06/19/2018 18:02   Ct Cervical Spine Wo Contrast  Result Date: 06/19/2018 CLINICAL DATA:  Pain post being thrown from a horse. EXAM: CT  HEAD WITHOUT CONTRAST CT CERVICAL SPINE WITHOUT CONTRAST TECHNIQUE: Multidetector CT imaging of the head and cervical spine was performed following the standard protocol without intravenous contrast. Multiplanar CT image reconstructions of the cervical spine were also generated. COMPARISON:  None. FINDINGS: CT HEAD FINDINGS Brain: No evidence of acute infarction, hemorrhage, hydrocephalus, extra-axial collection or mass lesion/mass effect. Nonspecific area of encephalomalacia in the left frontal lobe, in the region of the operculum. Vascular: Calcific atherosclerotic disease of the intra cavernous carotid arteries. Skull: Normal. Negative for fracture or focal lesion. Sinuses/Orbits: No acute finding. Other: None. CT CERVICAL SPINE FINDINGS Alignment: Reversal of cervical  lordosis. Skull base and vertebrae: No acute fracture. No primary bone lesion or focal pathologic process. Soft tissues and spinal canal: No prevertebral fluid or swelling. No visible canal hematoma. Prior C6-C7 anterior fusion. Disc levels:  Multilevel osteoarthritic changes. Upper chest: Thickening of the left apical pleura. Other: None. IMPRESSION: No acute intracranial abnormality. Area of encephalomalacia in the left frontal lobe, nonspecific. This may be due to prior small ischemic infarction or remote trauma. No evidence of acute traumatic injury to the cervical spine. Reversal of the cervical lordosis and multilevel osteoarthritic changes. Intact C6-C7 anterior spinal fusion. Electronically Signed   By: Fidela Salisbury M.D.   On: 06/19/2018 17:56   Ct Abdomen Pelvis W Contrast  Result Date: 06/19/2018 CLINICAL DATA:  67 y/o M; thrown from a horse with diffuse pain from head to legs. EXAM: CT CHEST, ABDOMEN, AND PELVIS WITH CONTRAST TECHNIQUE: Multidetector CT imaging of the chest, abdomen and pelvis was performed following the standard protocol during bolus administration of intravenous contrast. Multidetector CT imaging of the thoracic spine was performed without intravenous contrast administration. Multiplanar CT image reconstructions were also generated. Multidetector CT imaging of the lumbar spine was performed without intravenous contrast administration. Multiplanar CT image reconstructions were also generated. CONTRAST:  140mL ISOVUE-300 IOPAMIDOL (ISOVUE-300) INJECTION 61% COMPARISON:  None. FINDINGS: CT CHEST FINDINGS Cardiovascular: No significant vascular findings. Normal heart size. No pericardial effusion. Mediastinum/Nodes: No enlarged mediastinal, hilar, or axillary lymph nodes. Thyroid gland, trachea, and esophagus demonstrate no significant findings. Lungs/Pleura: Lungs are clear. No pleural effusion or pneumothorax. Musculoskeletal: Left rib 5-8 minimally displaced acute fractures of  the rib necks. Chronic right rib fracture deformity. CT ABDOMEN PELVIS FINDINGS Hepatobiliary: No hepatic injury or perihepatic hematoma. Gallbladder is unremarkable Pancreas: Unremarkable. No pancreatic ductal dilatation or surrounding inflammatory changes. Spleen: No splenic injury or perisplenic hematoma. Adrenals/Urinary Tract: No adrenal hemorrhage or renal injury identified. Bladder is unremarkable. Stomach/Bowel: Stomach is within normal limits. Appendix appears normal. 36 mm diverticulum arising from the third segment of the duodenum without inflammatory changes. No evidence of bowel wall thickening, distention, or inflammatory changes. Vascular/Lymphatic: Aortic atherosclerosis. No enlarged abdominal or pelvic lymph nodes. Reproductive: Ovoid soft tissue structure within the right lower inguinal canal (series 1, image 132) with soft tissue attenuation, possibly undescended testicle. Inguinal canal and scrotal ultrasound is recommended. Other: No abdominal wall hernia or abnormality. No abdominopelvic ascites. Musculoskeletal: No fracture is seen. CT THORACIC SPINE FINDINGS Segmentation: 5 lumbar type vertebrae. Alignment: Normal. Vertebrae: No acute fracture or focal pathologic process. Paraspinal and other soft tissues: As above. Disc levels: Intervertebral disc spaces are maintained. CT LUMBAR SPINE FINDINGS Segmentation: 5 lumbar type vertebrae. Alignment: Normal. Vertebrae: No acute fracture or focal pathologic process. 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 Disc levels: Mild disc and facet degenerative changes of the lumbar spine. IMPRESSION: 1.  Left rib 5-8 minimally displaced acute posterior fractures of the rib necks. 2. No additional fracture or internal injury of the chest, abdomen, pelvis, thoracic spine, or lumbar spine identified. Electronically Signed   By: Kristine Garbe M.D.   On: 06/19/2018 18:02   Ct T-spine No  Charge  Result Date: 06/19/2018 CLINICAL DATA:  66 y/o M; thrown from a horse with diffuse pain from head to legs. EXAM: CT CHEST, ABDOMEN, AND PELVIS WITH CONTRAST TECHNIQUE: Multidetector CT imaging of the chest, abdomen and pelvis was performed following the standard protocol during bolus administration of intravenous contrast. Multidetector CT imaging of the thoracic spine was performed without intravenous contrast administration. Multiplanar CT image reconstructions were also generated. Multidetector CT imaging of the lumbar spine was performed without intravenous contrast administration. Multiplanar CT image reconstructions were also generated. CONTRAST:  12mL ISOVUE-300 IOPAMIDOL (ISOVUE-300) INJECTION 61% COMPARISON:  None. FINDINGS: CT CHEST FINDINGS Cardiovascular: No significant vascular findings. Normal heart size. No pericardial effusion. Mediastinum/Nodes: No enlarged mediastinal, hilar, or axillary lymph nodes. Thyroid gland, trachea, and esophagus demonstrate no significant findings. Lungs/Pleura: Lungs are clear. No pleural effusion or pneumothorax. Musculoskeletal: Left rib 5-8 minimally displaced acute fractures of the rib necks. Chronic right rib fracture deformity. CT ABDOMEN PELVIS FINDINGS Hepatobiliary: No hepatic injury or perihepatic hematoma. Gallbladder is unremarkable Pancreas: Unremarkable. No pancreatic ductal dilatation or surrounding inflammatory changes. Spleen: No splenic injury or perisplenic hematoma. Adrenals/Urinary Tract: No adrenal hemorrhage or renal injury identified. Bladder is unremarkable. Stomach/Bowel: Stomach is within normal limits. Appendix appears normal. 36 mm diverticulum arising from the third segment of the duodenum without inflammatory changes. No evidence of bowel wall thickening, distention, or inflammatory changes. Vascular/Lymphatic: Aortic atherosclerosis. No enlarged abdominal or pelvic lymph nodes. Reproductive: Ovoid soft tissue structure within the  right lower inguinal canal (series 1, image 132) with soft tissue attenuation, possibly undescended testicle. Inguinal canal and scrotal ultrasound is recommended. Other: No abdominal wall hernia or abnormality. No abdominopelvic ascites. Musculoskeletal: No fracture is seen. CT THORACIC SPINE FINDINGS Segmentation: 5 lumbar type vertebrae. Alignment: Normal. Vertebrae: No acute fracture or focal pathologic process. Paraspinal and other soft tissues: As above. Disc levels: Intervertebral disc spaces are maintained. CT LUMBAR SPINE FINDINGS Segmentation: 5 lumbar type vertebrae. Alignment: Normal. Vertebrae: No acute fracture or focal pathologic process. 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 Disc levels: Mild disc and facet degenerative changes of the lumbar spine. IMPRESSION: 1. Left rib 5-8 minimally displaced acute posterior fractures of the rib necks. 2. No additional fracture or internal injury of the chest, abdomen, pelvis, thoracic spine, or lumbar spine identified. Electronically Signed   By: Kristine Garbe M.D.   On: 06/19/2018 18:02   Ct L-spine No Charge  Result Date: 06/19/2018 CLINICAL DATA:  67 y/o M; thrown from a horse with diffuse pain from head to legs. EXAM: CT CHEST, ABDOMEN, AND PELVIS WITH CONTRAST TECHNIQUE: Multidetector CT imaging of the chest, abdomen and pelvis was performed following the standard protocol during bolus administration of intravenous contrast. Multidetector CT imaging of the thoracic spine was performed without intravenous contrast administration. Multiplanar CT image reconstructions were also generated. Multidetector CT imaging of the lumbar spine was performed without intravenous contrast administration. Multiplanar CT image reconstructions were also generated. CONTRAST:  121mL ISOVUE-300 IOPAMIDOL (ISOVUE-300) INJECTION 61% COMPARISON:  None. FINDINGS: CT CHEST FINDINGS Cardiovascular: No  significant vascular findings. Normal heart size. No pericardial effusion. Mediastinum/Nodes: No enlarged mediastinal, hilar, or axillary lymph nodes.  Thyroid gland, trachea, and esophagus demonstrate no significant findings. Lungs/Pleura: Lungs are clear. No pleural effusion or pneumothorax. Musculoskeletal: Left rib 5-8 minimally displaced acute fractures of the rib necks. Chronic right rib fracture deformity. CT ABDOMEN PELVIS FINDINGS Hepatobiliary: No hepatic injury or perihepatic hematoma. Gallbladder is unremarkable Pancreas: Unremarkable. No pancreatic ductal dilatation or surrounding inflammatory changes. Spleen: No splenic injury or perisplenic hematoma. Adrenals/Urinary Tract: No adrenal hemorrhage or renal injury identified. Bladder is unremarkable. Stomach/Bowel: Stomach is within normal limits. Appendix appears normal. 36 mm diverticulum arising from the third segment of the duodenum without inflammatory changes. No evidence of bowel wall thickening, distention, or inflammatory changes. Vascular/Lymphatic: Aortic atherosclerosis. No enlarged abdominal or pelvic lymph nodes. Reproductive: Ovoid soft tissue structure within the right lower inguinal canal (series 1, image 132) with soft tissue attenuation, possibly undescended testicle. Inguinal canal and scrotal ultrasound is recommended. Other: No abdominal wall hernia or abnormality. No abdominopelvic ascites. Musculoskeletal: No fracture is seen. CT THORACIC SPINE FINDINGS Segmentation: 5 lumbar type vertebrae. Alignment: Normal. Vertebrae: No acute fracture or focal pathologic process. Paraspinal and other soft tissues: As above. Disc levels: Intervertebral disc spaces are maintained. CT LUMBAR SPINE FINDINGS Segmentation: 5 lumbar type vertebrae. Alignment: Normal. Vertebrae: No acute fracture or focal pathologic process. 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 Disc levels: Mild disc and  facet degenerative changes of the lumbar spine. IMPRESSION: 1. Left rib 5-8 minimally displaced acute posterior fractures of the rib necks. 2. No additional fracture or internal injury of the chest, abdomen, pelvis, thoracic spine, or lumbar spine identified. Electronically Signed   By: Kristine Garbe M.D.   On: 06/19/2018 18:02    Procedures Procedures (including critical care time)  Medications Ordered in ED Medications - No data to display   Initial Impression / Assessment and Plan / ED Course  I have reviewed the triage vital signs and the nursing notes.  Pertinent labs & imaging results that were available during my care of the patient were reviewed by me and considered in my medical decision making (see chart for details).    ReViewed CT scans, EKG, and labs.  Discussed results with patient.  Plan incentive spirometer.  Patient with rib fractures [id number].  He has no underlying lung problems is not a smoker.  We have discussed appropriate and frequent use of incentive spirometer.  We have also discussed return precautions and need for close follow-up.  He has mild hypokalemia we discussed that he needs to eat potassium rich few foods and have this rechecked this week.  Plan prescription for short course of Percocet.  He is using Aleve for hip pain and will continue to use this. Patient called to alert to the fact that there needs to be a follow-up ultrasound done due to question of undescended testicle with some inguinal canal Final Clinical Impressions(s) / ED Diagnoses   Final diagnoses:  Animal-rider injured by fall from or being thrown from horse in [place] accident, initial encounter  Hypokalemia  Closed fracture of multiple ribs of left side, initial encounter    ED Discharge Orders    None       Pattricia Boss, MD 06/19/18 Tresa Moore    Pattricia Boss, MD 06/19/18 1921

## 2018-06-19 NOTE — ED Notes (Signed)
Patient transported to CT 

## 2018-09-23 IMAGING — CT CT T SPINE W/O CM
4 of 6 series · 16 of 33 positions shown, 18 images · IV contrast (iopamidol)
Comparison: None.

CLINICAL DATA: 67 y/o M; thrown from a horse with diffuse pain from
head to legs.

EXAM:
CT CHEST, ABDOMEN, AND PELVIS WITH CONTRAST
TECHNIQUE: Multidetector CT imaging of the chest, abdomen and pelvis was
performed following the standard protocol during bolus
administration of intravenous contrast. Multidetector CT imaging of
the thoracic spine was performed without intravenous contrast
administration. Multiplanar CT image reconstructions were also
generated. Multidetector CT imaging of the lumbar spine was
performed without intravenous contrast administration. Multiplanar
CT image reconstructions were also generated.
CONTRAST:  100mL 1KW1GF-H00 IOPAMIDOL (1KW1GF-H00) INJECTION 61%

[Series 3: lspine st · axial · 0.33mm/px · z∈[-704,-538]mm · 5 of 125 slices shown, 7 images]
[im 21/125  soft-tissue]
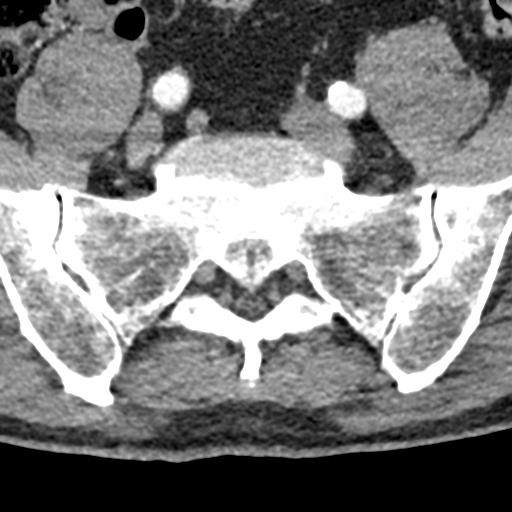
[im 21/125  bone]
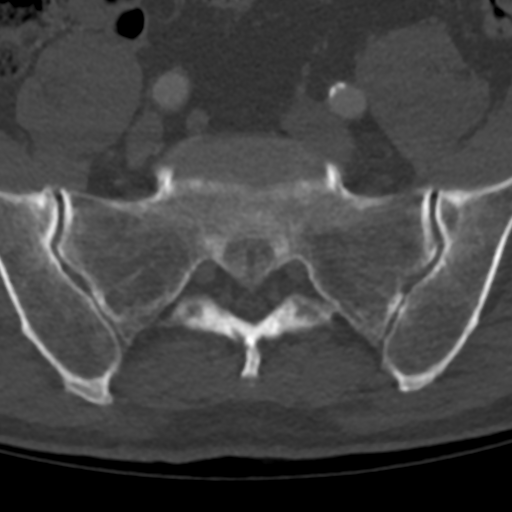
[im 42/125  bone]
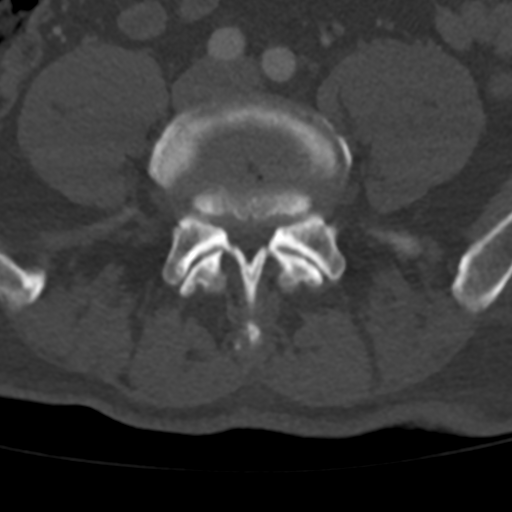
[im 63/125  bone]
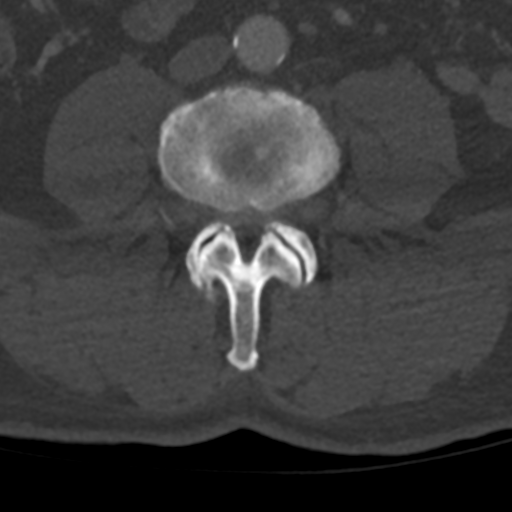
[im 83/125  bone]
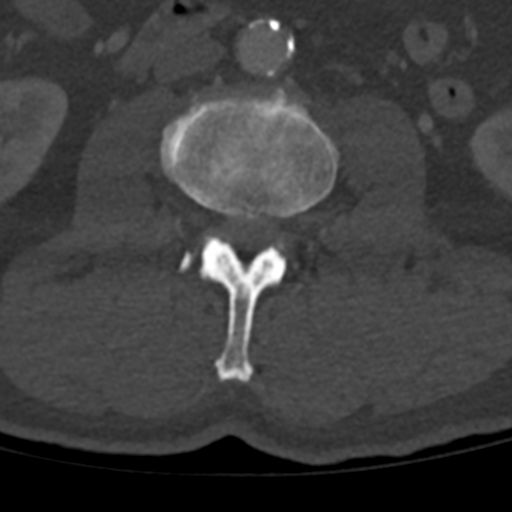
[im 104/125  soft-tissue]
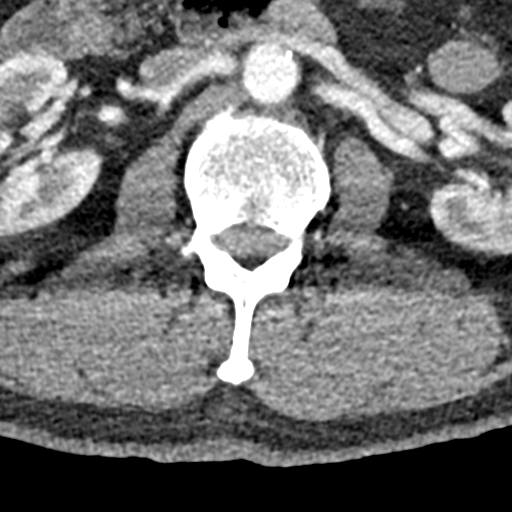
[im 104/125  bone]
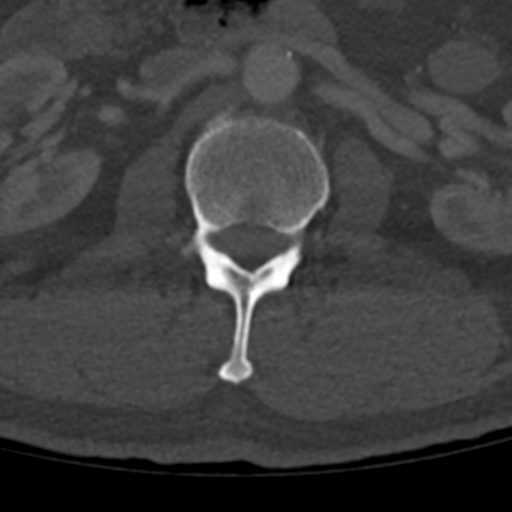

[Series 5: lspine bone · axial · 0.33mm/px · z∈[-704,-538]mm · 5 of 125 slices shown]
[im 21/125  bone]
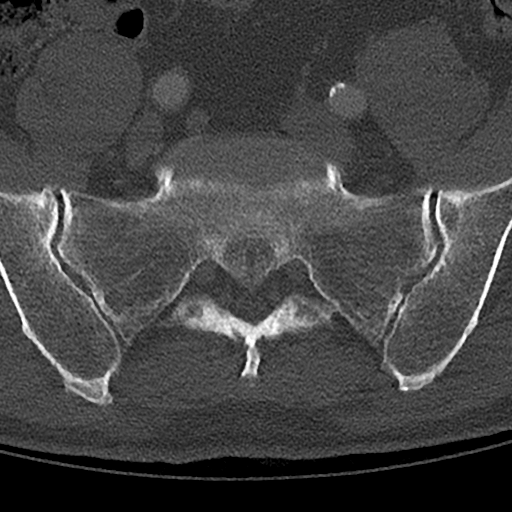
[im 42/125  bone]
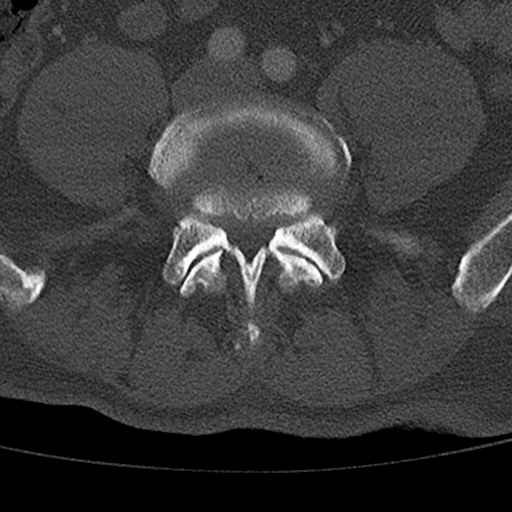
[im 63/125  bone]
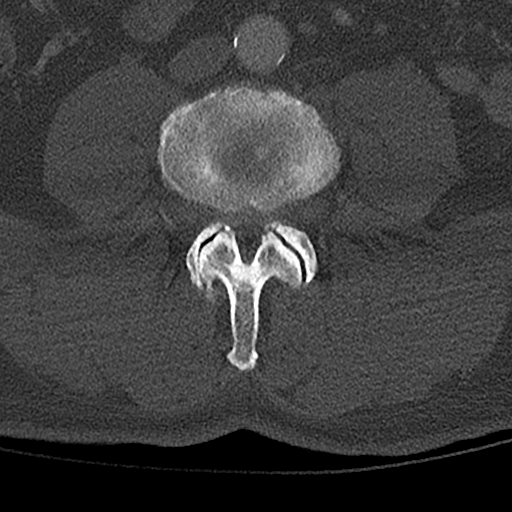
[im 83/125  bone]
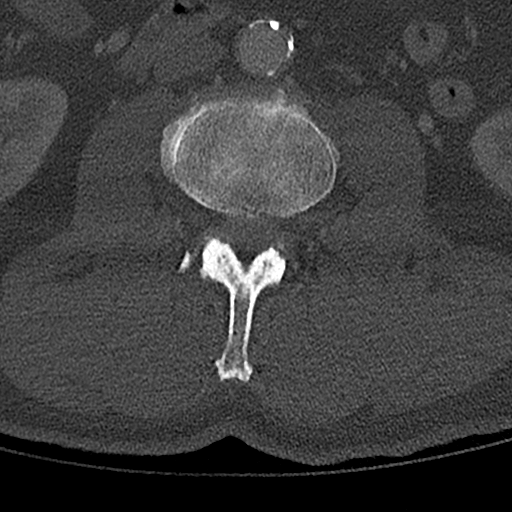
[im 104/125  bone]
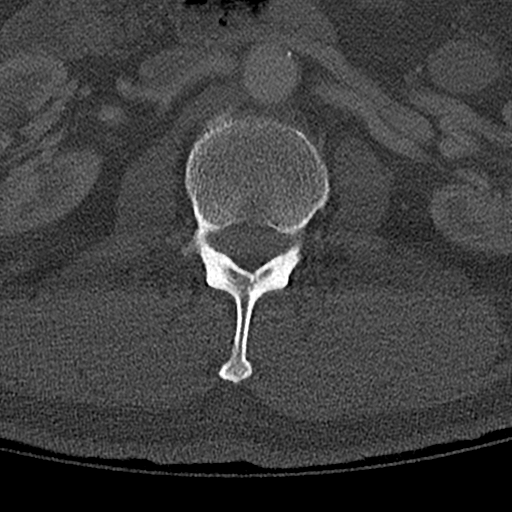

[Series 602: coronal st · coronal · 0.49mm/px · 1 of 60 slices shown]
[im 30/60  bone]
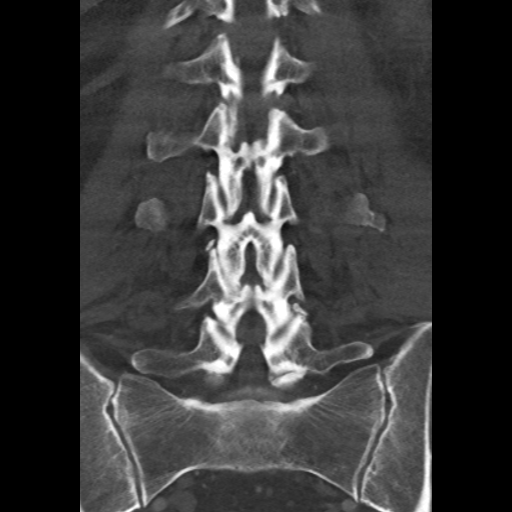

[Series 603: sagittal st · sagittal · 0.49mm/px · 5 of 82 slices shown]
[im 14/82  bone]
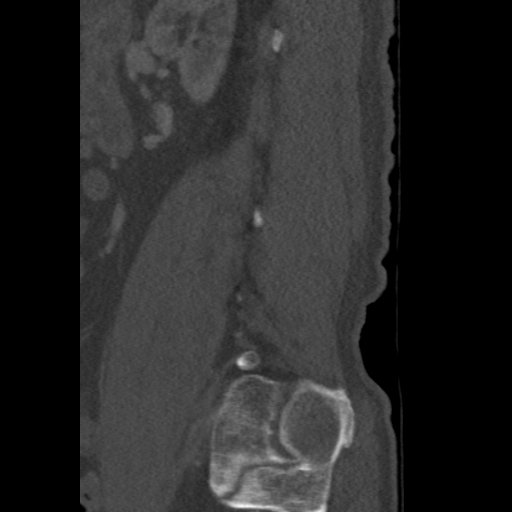
[im 28/82  bone]
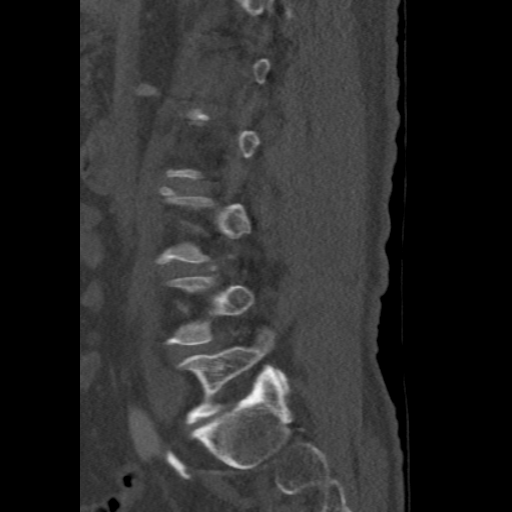
[im 41/82  bone]
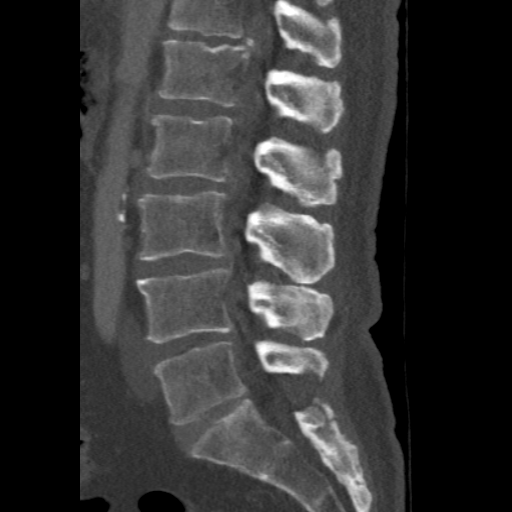
[im 55/82  bone]
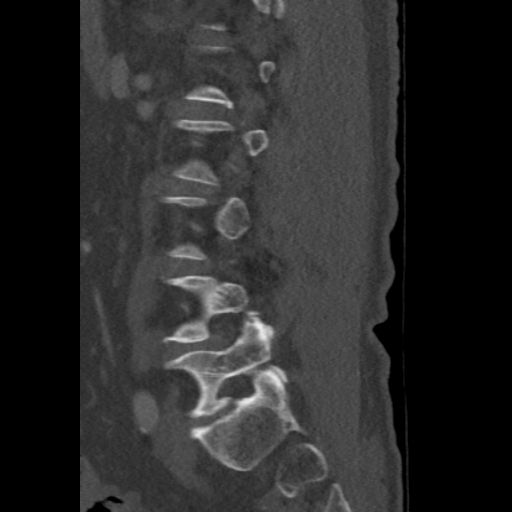
[im 68/82  bone]
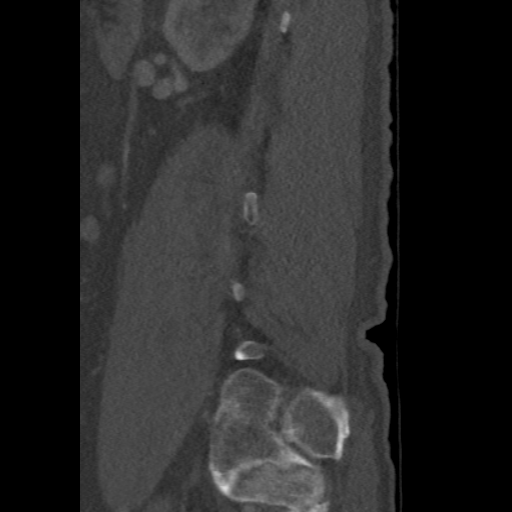

[16 of 33 positions shown; findings below may reference images not displayed]

FINDINGS: CT CHEST FINDINGS

Cardiovascular: No significant vascular findings. Normal heart size.
No pericardial effusion.

Mediastinum/Nodes: No enlarged mediastinal, hilar, or axillary lymph
nodes. Thyroid gland, trachea, and esophagus demonstrate no
significant findings.

Lungs/Pleura: Lungs are clear. No pleural effusion or pneumothorax.

Musculoskeletal: Left rib 5-8 minimally displaced acute fractures of
the rib necks. Chronic right rib fracture deformity.

CT ABDOMEN PELVIS FINDINGS

Hepatobiliary: No hepatic injury or perihepatic hematoma.
Gallbladder is unremarkable

Pancreas: Unremarkable. No pancreatic ductal dilatation or
surrounding inflammatory changes.

Spleen: No splenic injury or perisplenic hematoma.

Adrenals/Urinary Tract: No adrenal hemorrhage or renal injury
identified. Bladder is unremarkable.

Stomach/Bowel: Stomach is within normal limits. Appendix appears
normal. 36 mm diverticulum arising from the third segment of the
duodenum without inflammatory changes. No evidence of bowel wall
thickening, distention, or inflammatory changes.

Vascular/Lymphatic: Aortic atherosclerosis. No enlarged abdominal or
pelvic lymph nodes.

Reproductive: Ovoid soft tissue structure within the right lower
inguinal canal (series 1, image 132) with soft tissue attenuation,
possibly undescended testicle. Inguinal canal and scrotal ultrasound
is recommended.

Other: No abdominal wall hernia or abnormality. No abdominopelvic
ascites.

Musculoskeletal: No fracture is seen.

CT THORACIC SPINE FINDINGS

Segmentation: 5 lumbar type vertebrae.

Alignment: Normal.

Vertebrae: No acute fracture or focal pathologic process.

Paraspinal and other soft tissues: As above.

Disc levels: Intervertebral disc spaces are maintained.

CT LUMBAR SPINE FINDINGS

Segmentation: 5 lumbar type vertebrae.

Alignment: Normal.

Vertebrae: No acute fracture or focal pathologic process. Lumbar
congenital spinal stenosis with short pedicles

Paraspinal and other soft tissues: As above.

Disc levels: Mild disc and facet degenerative changes of the lumbar
spine.
IMPRESSION: 1. Left rib 5-8 minimally displaced acute posterior fractures of the
rib necks.
2. No additional fracture or internal injury of the chest, abdomen,
pelvis, thoracic spine, or lumbar spine identified.

By: Moseki Wiid M.D.

## 2018-10-28 ENCOUNTER — Encounter (INDEPENDENT_AMBULATORY_CARE_PROVIDER_SITE_OTHER): Payer: Self-pay | Admitting: *Deleted

## 2018-11-04 ENCOUNTER — Encounter: Payer: Self-pay | Admitting: Internal Medicine

## 2019-01-11 ENCOUNTER — Ambulatory Visit (INDEPENDENT_AMBULATORY_CARE_PROVIDER_SITE_OTHER): Payer: No Typology Code available for payment source | Admitting: Gastroenterology

## 2019-01-11 ENCOUNTER — Other Ambulatory Visit: Payer: Self-pay | Admitting: *Deleted

## 2019-01-11 ENCOUNTER — Encounter: Payer: Self-pay | Admitting: *Deleted

## 2019-01-11 ENCOUNTER — Telehealth: Payer: Self-pay | Admitting: *Deleted

## 2019-01-11 ENCOUNTER — Encounter: Payer: Self-pay | Admitting: Gastroenterology

## 2019-01-11 DIAGNOSIS — R195 Other fecal abnormalities: Secondary | ICD-10-CM | POA: Diagnosis not present

## 2019-01-11 DIAGNOSIS — Z860100 Personal history of colon polyps, unspecified: Secondary | ICD-10-CM | POA: Insufficient documentation

## 2019-01-11 NOTE — Assessment & Plan Note (Signed)
Pleasant 68 year old gentleman presenting from [place] system at the request of Dr. Cornelia Copa for surveillance colonoscopy.  Patient has a history of colon polyps.  A year ago he had a positive FIT, attempted colonoscopy July 2019 incomplete due to poor prep.  Patient states he only drank 1 or 2 glasses but developed severe vomiting and therefore was not adequately prepped.  Generally denies any constipation however when he takes Imodium as described above, he can go days without a bowel movement.  We discussed modified bowel prep today.  No Imodium for 10 days prior to colonoscopy.  Dulcolax 15 mg orally daily starting 4 days before his bowel prep.  2 full days of clear liquids.  He was given a sample of Suprep for his bowel prep.  Plan for deep sedation given history of chronic antidepressants/sleep aids.  I have discussed the risks, alternatives, benefits with regards to but not limited to the risk of reaction to medication, bleeding, infection, perforation and the patient is agreeable to proceed. Written consent to be obtained.

## 2019-01-11 NOTE — Telephone Encounter (Signed)
Pre-op scheduled for 02/09/2019 at 11:00am. LMOVM. Letter mailed

## 2019-01-11 NOTE — Patient Instructions (Signed)
Colonoscopy as scheduled. See separate instructions.  

## 2019-01-11 NOTE — Progress Notes (Signed)
Primary Care Physician:  [facility]  Primary Gastroenterologist:  Garfield Cornea, MD   Chief Complaint  Patient presents with  . Colonoscopy    HPI:  Wesley Gregory is a 68 y.o. male here at the request of Dr. Cornelia Copa with [facility] for surveillance colonoscopy.  He has a history of colon polyps.  Histology unknown per their records.  Last colonoscopy attempted in July 2019 (for positive FIT 11/2017) but incomplete secondary to poor prep.  Patient developed severe emesis with GoLYTELY prep, after 1-2 glasses.   Patient is doing well. He desires attempting another colonoscopy as long as he gets a different prep. Typically has BM every other day. 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 Will get backed up after Imodium. GERD well controlled on omeprazole when needed. Symptoms are usually food related and he can control. No dysphagia. No n/v. No abdominal pain. No melena, brbpr.     Current Outpatient Medications  Medication Sig Dispense Refill  . aspirin EC 81 MG tablet Take 81 mg by mouth daily.    Marland Kitchen atorvastatin (LIPITOR) 80 MG tablet Take 40 mg by mouth at bedtime.     . cetirizine (ZYRTEC) 10 MG tablet Take 10 mg by mouth daily as needed for allergies.    . clindamycin (CLEOCIN T) 1 % lotion Apply 1 application topically 2 (two) times daily as needed (for flare up of hair bumps on neck and scalp).    . hydrochlorothiazide (HYDRODIURIL) 25 MG tablet Take 25 mg by mouth daily.    . meloxicam (MOBIC) 15 MG tablet Take 15 mg by mouth daily.    . naproxen sodium (ALEVE) 220 MG tablet Take 440 mg by mouth daily as needed (pain).    . nortriptyline (PAMELOR) 10 MG capsule Take 10 mg by mouth at bedtime.    Marland Kitchen omeprazole (PRILOSEC) 40 MG capsule Take 40 mg by mouth daily.    . sildenafil (VIAGRA) 100 MG tablet Take 100 mg by mouth daily as needed for erectile dysfunction.      No current facility-administered medications for this visit.     Allergies as of 01/11/2019 - Review Complete 01/11/2019  Allergen Reaction Noted  . Sulfa antibiotics Other (See Comments) 09/10/2016    Past Medical History:  Diagnosis Date  . Anxiety   . Arthritis   . CVA (cerebral vascular accident) ([place])    report stroke 2018   . Depression   . DVT (deep venous thrombosis) ([place])    after leg injury several years ago  . Dyspnea    with injury  . GERD (gastroesophageal reflux disease)   . Head injury with loss of consciousness ([place])    short loss of consciouness, numerous head injuries  . History of kidney stones    Passed a couple  . Hypertension   . Panic attacks   . PTSD (post-traumatic stress disorder)     Past Surgical History:  Procedure Laterality Date  . ANKLE SURGERY Left    fracture repair- pins  . APPENDECTOMY    . CERVICAL DISC SURGERY  2007   twice  . COLON RESECTION  1980's   abdominal injury with appendectomy  . COLONOSCOPY  2007   Dr. Gala Romney: normal. next one in 2017  . CYSTOSCOPY    . KNEE ARTHROSCOPY Bilateral   . MANDIBLE FRACTURE SURGERY    . SHOULDER ARTHROSCOPY  x 3 rotatoar cuff  . SHOULDER ARTHROSCOPY WITH ROTATOR CUFF REPAIR AND OPEN BICEPS TENODESIS Right 09/16/2016   Procedure: SHOULDER DIAGNOSTIC OPERATIVE ARTHROSCOPY WITH MINI-OPEN ROTATOR CUFF REPAIR AND  BICEPS TENODESIS.;  Surgeon: Meredith Pel, MD;  Location: [facility];  Service: Orthopedics;  Laterality: Right;    Family History  Problem Relation Age of Onset  . Heart disease Mother   . Heart disease Father   . Colon cancer Neg Hx     Social History   Socioeconomic History  . Marital status: Widowed    Spouse name: Not on file  . Number of children: Not on file  . Years of education: Not on file  . Highest education level: Not on file  Occupational History  . Not on file  Social Needs  . Financial resource strain: Not on file  . Food insecurity:    Worry:  Not on file    Inability: Not on file  . Transportation needs:    Medical: Not on file    Non-medical: Not on file  Tobacco Use  . Smoking status: Never Smoker  . Smokeless tobacco: Never Used  Substance and Sexual Activity  . Alcohol use: No  . Drug use: No  . Sexual activity: Not on file  Lifestyle  . Physical activity:    Days per week: Not on file    Minutes per session: Not on file  . Stress: Not on file  Relationships  . Social connections:    Talks on phone: Not on file    Gets together: Not on file    Attends religious service: Not on file    Active member of club or organization: Not on file    Attends meetings of clubs or organizations: Not on file    Relationship status: Not on file  . Intimate partner violence:    Fear of current or ex partner: Not on file    Emotionally abused: Not on file    Physically abused: Not on file    Forced sexual activity: Not on file  Other Topics Concern  . Not on file  Social History Narrative   Veteran: [employer]      ROS:  General: Negative for anorexia, weight loss, fever, chills, fatigue, weakness. Eyes: Negative for vision changes.  ENT: Negative for hoarseness, difficulty swallowing , nasal congestion. CV: Negative for chest pain, angina, palpitations, dyspnea on exertion, peripheral edema.  Respiratory: Negative for dyspnea at rest, dyspnea on exertion, cough, sputum, wheezing.  GI: See history of present illness. GU:  Negative for dysuria, hematuria, urinary incontinence, urinary frequency, nocturnal urination.  MS: chronic for joint pain, low back pain.  Derm: Negative for rash or itching.  Neuro: Negative for weakness, abnormal sensation, seizure, frequent headaches, memory loss, confusion.  Psych: Negative for anxiety, depression, suicidal ideation, hallucinations.  Endo: Negative for unusual weight change.  Heme: Negative for bruising or bleeding. Allergy: Negative for rash or hives.    Physical  Examination:  BP 122/88   Pulse (!) 110   Temp (!) 97.5 F (36.4 C) (Oral)   Ht 6\' 5"  (1.956 m)   Wt 216 lb 3.2 oz (98.1 kg)   BMI 25.64 kg/m    General: Well-nourished, well-developed in no acute distress.  Head: Normocephalic, atraumatic.   Eyes: Conjunctiva pink, no icterus. Mouth: Oropharyngeal mucosa moist and pink , no lesions erythema or exudate. Neck: Supple without thyromegaly, masses, or lymphadenopathy.  Lungs: Clear to auscultation bilaterally.  Heart: Regular rate  and rhythm, no murmurs rubs or gallops.  Abdomen: Bowel sounds are normal, nontender, nondistended, no hepatosplenomegaly or masses, no abdominal bruits or    hernia , no rebound or guarding.   Rectal: deferred Extremities: No lower extremity edema. No clubbing or deformities.  Neuro: Alert and oriented x 4 , grossly normal neurologically.  Skin: Warm and dry, no rash or jaundice.   Psych: Alert and cooperative, normal mood and affect.  Labs: December 2019: A1c 5.3, sodium 143, BUN 15, creatinine 1.0 January 2019: Hemoglobin 16.2, hematocrit 45.8, platelets 221,000  Imaging Studies: No results found.

## 2019-01-12 NOTE — Progress Notes (Signed)
CC'D TO PCP °

## 2019-02-07 NOTE — Patient Instructions (Signed)
Wesley Gregory  02/07/2019     @PREFPERIOPPHARMACY @   Your procedure is scheduled on 02/17/19.  Report to [name] at 1205  pm  Call this number if you have problems the morning of surgery:  [phone]   Remember:  Do not eat or drink after midnight.  Follow prep instructions that were provided to you by your doctor.     Take these medicines the morning of surgery with A SIP OF WATER zyrtec & prilosec    Do not wear jewelry, make-up or nail polish.  Do not wear lotions, powders, or perfumes, or deodorant.  Do not shave 48 hours prior to surgery.  Men may shave face and neck.  Do not bring valuables to the hospital.  [facility] is not responsible for any belongings or valuables.  Contacts, dentures or bridgework may not be worn into surgery.  Leave your suitcase in the car.  After surgery it may be brought to your room.  For patients admitted to the hospital, discharge time will be determined by your treatment team.  Patients discharged the day of surgery will not be allowed to drive home.   Name and phone number of your driver:   family Special instructions:   Please read over the following fact sheets that you were given. Anesthesia Post-op Instructions and Care and Recovery After Surgery      PATIENT INSTRUCTIONS POST-ANESTHESIA  IMMEDIATELY FOLLOWING SURGERY:  Do not drive or operate machinery for the first twenty four hours after surgery.  Do not make any important decisions for twenty four hours after surgery or while taking narcotic pain medications or sedatives.  If you develop intractable nausea and vomiting or a severe headache please notify your doctor immediately.  FOLLOW-UP:  Please make an appointment with your surgeon as instructed. You do not need to follow up with anesthesia unless specifically instructed to do so.  WOUND CARE INSTRUCTIONS (if applicable):  Keep a dry clean dressing on the anesthesia/puncture wound site if there is drainage.  Once the  wound has quit draining you may leave it open to air.  Generally you should leave the bandage intact for twenty four hours unless there is drainage.  If the epidural site drains for more than 36-48 hours please call the anesthesia department.  QUESTIONS?:  Please feel free to call your physician or the hospital operator if you have any questions, and they will be happy to assist you.      Colonoscopy, Adult A colonoscopy is an exam to look at the entire large intestine. During the exam, a lubricated, flexible tube that has a camera on the end of it is inserted into the anus and then passed into the rectum, colon, and other parts of the large intestine. You may have a colonoscopy as a part of normal colorectal screening or if you have certain symptoms, such as:  Lack of red blood cells (anemia).  Diarrhea that does not go away.  Abdominal pain.  Blood in your stool (feces). A colonoscopy can help screen for and diagnose medical problems, including:  Tumors.  Polyps.  Inflammation.  Areas of bleeding. Tell a health care provider about:  Any allergies you have.  All medicines you are taking, including vitamins, herbs, eye drops, creams, and over-the-counter medicines.  Any problems you or family members have had with anesthetic medicines.  Any blood disorders you have.  Any surgeries you have had.  Any medical conditions you have.  Any problems you  have had passing stool. What are the risks? Generally, this is a safe procedure. However, problems may occur, including:  Bleeding.  A tear in the intestine.  A reaction to medicines given during the exam.  Infection (rare). What happens before the procedure? Eating and drinking restrictions Follow instructions from your health care provider about eating and drinking, which may include:  A few days before the procedure - follow a low-fiber diet. Avoid nuts, seeds, dried fruit, raw fruits, and vegetables.  1-3 days  before the procedure - follow a clear liquid diet. Drink only clear liquids, such as clear broth or bouillon, black coffee or tea, clear juice, clear soft drinks or sports drinks, gelatin dessert, and popsicles. Avoid any liquids that contain red or purple dye.  On the day of the procedure - do not eat or drink anything starting 2 hours before the procedure, or within the time period that your health care provider recommends. Up to 2 hours before the procedure, you may continue to drink clear liquids, such as water or clear fruit juice. Bowel prep If you were prescribed an oral bowel prep to clean out your colon:  Take it as told by your health care provider. Starting the day before your procedure, you will need to drink a large amount of medicated liquid. The liquid will cause you to have multiple loose stools until your stool is almost clear or light green.  If your skin or anus gets irritated from diarrhea, you may use these to relieve the irritation: ? Medicated wipes, such as adult wet wipes with aloe and vitamin E. ? A skin-soothing product like petroleum jelly.  If you vomit while drinking the bowel prep, take a break for up to 60 minutes and then begin the bowel prep again. If vomiting continues and you cannot take the bowel prep without vomiting, call your health care provider.  To clean out your colon, you may also be given: ? Laxative medicines. ? Instructions about how to use an enema. General instructions  Ask your health care provider about: ? Changing or stopping your regular medicines or supplements. This is especially important if you are taking iron supplements, diabetes medicines, or blood thinners. ? Taking medicines such as aspirin and ibuprofen. These medicines can thin your blood. Do not take these medicines before the procedure if your health care provider tells you not to.  Plan to have someone take you home from the hospital or clinic. What happens during the  procedure?   An IV may be inserted into one of your veins.  You will be given medicine to help you relax (sedative).  To reduce your risk of infection: ? Your health care team will wash or sanitize their hands. ? Your anal area will be washed with soap.  You will be asked to lie on your side with your knees bent.  Your health care provider will lubricate a long, thin, flexible tube. The tube will have a camera and a light on the end.  The tube will be inserted into your anus.  The tube will be gently eased through your rectum and colon.  Air will be delivered into your colon to keep it open. You may feel some pressure or cramping.  The camera will be used to take images during the procedure.  A small tissue sample may be removed to be examined under a microscope (biopsy).  If small polyps are found, your health care provider may remove them and have them checked  for cancer cells.  When the exam is done, the tube will be removed. The procedure may vary among health care providers and hospitals. What happens after the procedure?  Your blood pressure, heart rate, breathing rate, and blood oxygen level will be monitored until the medicines you were given have worn off.  Do not drive for 24 hours after the exam.  You may have a small amount of blood in your stool.  You may pass gas and have mild abdominal cramping or bloating due to the air that was used to inflate your colon during the exam.  It is up to you to get the results of your procedure. Ask your health care provider, or the department performing the procedure, when your results will be ready. Summary  A colonoscopy is an exam to look at the entire large intestine.  During a colonoscopy, a lubricated, flexible tube with a camera on the end of it is inserted into the anus and then passed into the colon and other parts of the large intestine.  Follow instructions from your health care provider about eating and drinking  before the procedure.  If you were prescribed an oral bowel prep to clean out your colon, take it as told by your health care provider.  After your procedure, your blood pressure, heart rate, breathing rate, and blood oxygen level will be monitored until the medicines you were given have worn off. This information is not intended to replace advice given to you by your health care provider. Make sure you discuss any questions you have with your health care provider. Document Released: 10/17/2000 Document Revised: 08/12/2017 Document Reviewed: 01/01/2016 Elsevier Interactive Patient Education  2019 [employer].

## 2019-02-08 ENCOUNTER — Encounter (HOSPITAL_COMMUNITY): Payer: Self-pay

## 2019-02-09 ENCOUNTER — Encounter (HOSPITAL_COMMUNITY)
Admission: RE | Admit: 2019-02-09 | Discharge: 2019-02-09 | Disposition: A | Discharge status: Home / Self Care | Payer: Non-veteran care | Source: Ambulatory Visit | Attending: Internal Medicine | Admitting: Internal Medicine

## 2019-02-09 ENCOUNTER — Other Ambulatory Visit: Payer: Self-pay

## 2019-02-10 ENCOUNTER — Telehealth: Payer: Self-pay | Admitting: *Deleted

## 2019-02-10 NOTE — Telephone Encounter (Signed)
LMOVM

## 2019-02-14 NOTE — Telephone Encounter (Signed)
Called patient and is aware we are rescheduling procedure d/t COVID-19. He is unsure when he can r/s. I have offered several dates. He states he will call back when he gets his other calendar to r/s. Called endo and LMOVM

## 2019-02-17 ENCOUNTER — Encounter (HOSPITAL_COMMUNITY): Admission: RE | Payer: Self-pay | Source: Home / Self Care

## 2019-02-17 ENCOUNTER — Ambulatory Visit (HOSPITAL_COMMUNITY): Admission: RE | Admit: 2019-02-17 | Payer: Non-veteran care | Source: Home / Self Care | Admitting: Internal Medicine

## 2019-02-17 SURGERY — COLONOSCOPY WITH PROPOFOL
Anesthesia: Monitor Anesthesia Care

## 2020-01-05 ENCOUNTER — Other Ambulatory Visit: Payer: Self-pay

## 2020-01-05 ENCOUNTER — Ambulatory Visit (HOSPITAL_COMMUNITY)
Admission: RE | Admit: 2020-01-05 | Discharge: 2020-01-05 | Disposition: A | Discharge status: Home / Self Care | Payer: Non-veteran care | Source: Ambulatory Visit | Attending: Neurology | Admitting: Neurology

## 2020-01-05 DIAGNOSIS — Z791 Long term (current) use of non-steroidal anti-inflammatories (NSAID): Secondary | ICD-10-CM | POA: Insufficient documentation

## 2020-01-05 DIAGNOSIS — Z79899 Other long term (current) drug therapy: Secondary | ICD-10-CM | POA: Insufficient documentation

## 2020-01-05 DIAGNOSIS — R569 Unspecified convulsions: Secondary | ICD-10-CM | POA: Insufficient documentation

## 2020-01-05 DIAGNOSIS — Z7982 Long term (current) use of aspirin: Secondary | ICD-10-CM | POA: Diagnosis not present

## 2020-01-05 NOTE — Procedures (Addendum)
  [place] A. Merlene Laughter, MD     [email or web address]           HISTORY: This is a 69 year old man who is referred for electroencephalography for seizures.  MEDICATIONS:  Current Outpatient Medications:  .  aspirin EC 81 MG tablet, Take 81 mg by mouth daily., Disp: , Rfl:  .  atorvastatin (LIPITOR) 80 MG tablet, Take 40 mg by mouth at bedtime. , Disp: , Rfl:  .  bisacodyl (DULCOLAX) 5 MG EC tablet, Take 15 mg by mouth daily. , Disp: , Rfl:  .  cetirizine (ZYRTEC) 10 MG tablet, Take 10 mg by mouth daily as needed for allergies., Disp: , Rfl:  .  clindamycin (CLEOCIN T) 1 % lotion, Apply 1 application topically 2 (two) times daily as needed (for flare up of hair bumps on neck and scalp)., Disp: , Rfl:  .  hydrochlorothiazide (HYDRODIURIL) 25 MG tablet, Take 25 mg by mouth daily., Disp: , Rfl:  .  meloxicam (MOBIC) 15 MG tablet, Take 15 mg by mouth daily., Disp: , Rfl:  .  naproxen sodium (ALEVE) 220 MG tablet, Take 440 mg by mouth daily as needed (pain)., Disp: , Rfl:  .  nortriptyline (PAMELOR) 10 MG capsule, Take 10 mg by mouth at bedtime., Disp: , Rfl:  .  omeprazole (PRILOSEC) 40 MG capsule, Take 40 mg by mouth daily., Disp: , Rfl:  .  sildenafil (VIAGRA) 100 MG tablet, Take 100 mg by mouth daily as needed for erectile dysfunction., Disp: , Rfl:      ANALYSIS: A 16 channel recording using standard 10 20 measurements is conducted for 31 minutes.  There is a well-formed posterior dominant rhythm of 31 minutes which attenuates with eye opening. There is beta activity observed in the frontal areas. The recording transitions to theta slowing with rudimentary K complexes noted. Photic stimulation is carried out without abnormal changes in the background activity. There is no focal or lateralized slowing. There is no epileptiform activities noted.   IMPRESSION: 1. This is a normal recording of awake and drowsy states.      Raniyah Curenton A. Merlene Laughter, M.D.  Diplomate,  [employer] of Psychiatry and Neurology ( Neurology).

## 2020-01-05 NOTE — Progress Notes (Signed)
EEG complete - results pending 

## 2020-02-06 NOTE — Addendum Note (Signed)
Encounter addended by: Phillips Odor, MD on: 02/06/2020 9:39 PM  Actions taken: Clinical Note Signed

## 2020-06-11 ENCOUNTER — Ambulatory Visit (HOSPITAL_COMMUNITY): Payer: Non-veteran care | Admitting: Physical Therapy

## 2020-07-01 ENCOUNTER — Encounter: Payer: Self-pay | Admitting: Internal Medicine

## 2020-08-28 ENCOUNTER — Ambulatory Visit (INDEPENDENT_AMBULATORY_CARE_PROVIDER_SITE_OTHER): Payer: No Typology Code available for payment source | Admitting: Nurse Practitioner

## 2020-08-28 ENCOUNTER — Encounter: Payer: Self-pay | Admitting: Nurse Practitioner

## 2020-08-28 ENCOUNTER — Other Ambulatory Visit: Payer: Self-pay

## 2020-08-28 DIAGNOSIS — R195 Other fecal abnormalities: Secondary | ICD-10-CM

## 2020-08-28 DIAGNOSIS — Z0000 Encounter for general adult medical examination without abnormal findings: Secondary | ICD-10-CM

## 2020-08-28 NOTE — Progress Notes (Signed)
CC'ED TO PCP 

## 2020-08-28 NOTE — Patient Instructions (Signed)
Your health issues we discussed today were:   Need for screening colonoscopy: 1. We will schedule your colonoscopy for you 2. Further recommendations will follow your colonoscopy 3. Call us if you have any obvious bleeding or other GI complaints  Overall I recommend:  1. Continue your other current medications 2. Return for follow-up based on recommendations made after your procedure 3. Call us for any questions or concerns   ---------------------------------------------------------------  I am glad you have gotten your COVID-19 vaccination!  Even though you are fully vaccinated you should continue to follow CDC and state/local guidelines.  ---------------------------------------------------------------   At [facility] we value your feedback. You may receive a survey about your visit today. Please share your experience as we strive to create trusting relationships with our patients to provide genuine, compassionate, quality care.  We appreciate your understanding and patience as we review any laboratory studies, imaging, and other diagnostic tests that are ordered as we care for you. Our office policy is 5 business days for review of these results, and any emergent or urgent results are addressed in a timely manner for your best interest. If you do not hear from our office in 1 week, please contact us.   We also encourage the use of MyChart, which contains your medical information for your review as well. If you are not enrolled in this feature, an access code is on this after visit summary for your convenience. Thank you for allowing [place] to be involved in your care.  It was great to see you today!  I hope you have a great Fall!!

## 2020-08-28 NOTE — Progress Notes (Signed)
Referring Provider: [facility] [place] Physician:  [facility]:  Dr. Abbey Chatters  Chief Complaint  Patient presents with  . Consult    TCS was cancelled d/t covid 19. Had heem + stool    HPI:   Wesley Gregory is a 69 y.o. male who presents on referral from the [facility] to schedule colonoscopy.  Nurse/phone triage was deferred to office visit due to medications likely necessitating MAC sedation.  Patient was seen in our office 01/11/2019 also to schedule colonoscopy on behalf of the [facility]  History of colon polyps with last colonoscopy attempted in July 2019 for positive fit test but incomplete secondary to poor prep as he developed severe emesis with GoLYTELY.  At his last visit noted he is okay with attempting another colonoscopy.  Has intermittent diarrhea and uses Imodium on workdays to prevent loose stools.  GERD well controlled on PPI.  Symptoms usually food related can control them.  Recommended scheduling colonoscopy.  Unfortunately his colonoscopy had to be rescheduled due to COVID-19/coronavirus pandemic.  Today he states he is doing okay overall. Previously with heme+ stool but he states he never saw blood "I was told the last sample looked al ittle suspect, but never told the results." Denies abdominal pain, N/V, hematochezia, melena, fever, chills, unintentional weight loss. States he can't drink GoLytely prep. Denies URI or flu-like symptoms. Denies loss of sense of taste or smell. The patient has received COVID-19 vaccination(s). Denies chest pain, dyspnea, dizziness, lightheadedness, syncope, near syncope. Denies any other upper or lower GI symptoms.  Past Medical History:  Diagnosis Date  . Anxiety   . Arthritis   . CVA (cerebral vascular accident) ([place])    report stroke 2018   . Depression   . DVT (deep venous thrombosis) ([place])    after leg injury several years ago  . Dyspnea    with injury  . GERD  (gastroesophageal reflux disease)   . Head injury with loss of consciousness ([place])    short loss of consciouness, numerous head injuries  . History of kidney stones    Passed a couple  . Hypertension   . Panic attacks   . PTSD (post-traumatic stress disorder)     Past Surgical History:  Procedure Laterality Date  . ANKLE SURGERY Left    fracture repair- pins  . APPENDECTOMY    . CERVICAL DISC SURGERY  2007   twice  . COLON RESECTION  1980's   abdominal injury with appendectomy  . COLONOSCOPY  2007   Dr. Gala Romney: normal. next one in 2017  . CYSTOSCOPY    . KNEE ARTHROSCOPY Bilateral   . MANDIBLE FRACTURE SURGERY    . SHOULDER ARTHROSCOPY     x 3 rotatoar cuff  . SHOULDER ARTHROSCOPY WITH ROTATOR CUFF REPAIR AND OPEN BICEPS TENODESIS Right 09/16/2016   Procedure: SHOULDER DIAGNOSTIC OPERATIVE ARTHROSCOPY WITH MINI-OPEN ROTATOR CUFF REPAIR AND  BICEPS TENODESIS.;  Surgeon: Meredith Pel, MD;  Location: [place];  Service: Orthopedics;  Laterality: Right;    Current Outpatient Medications  Medication Sig Dispense Refill  . aspirin EC 81 MG tablet Take 81 mg by mouth daily.    Marland Kitchen atorvastatin (LIPITOR) 80 MG tablet Take 40 mg by mouth at bedtime.     . cetirizine (ZYRTEC) 10 MG tablet Take 10 mg by mouth daily as needed for allergies.    . clindamycin (CLEOCIN T) 1 % lotion Apply 1 application  topically 2 (two) times daily as needed (for flare up of hair bumps on neck and scalp).    . hydrochlorothiazide (HYDRODIURIL) 25 MG tablet Take 25 mg by mouth daily.    Marland Kitchen ibuprofen (IBU) 800 MG tablet Take 1 tablet by mouth. 1-2 times per day    . naproxen sodium (ALEVE) 220 MG tablet Take 440 mg by mouth daily as needed (pain).    . sildenafil (VIAGRA) 100 MG tablet Take 100 mg by mouth daily as needed for erectile dysfunction.    . traMADol (ULTRAM) 50 MG tablet 3 (three) times daily as needed.     No current facility-administered medications for this visit.    Allergies as of  08/28/2020 - Review Complete 08/28/2020  Allergen Reaction Noted  . Sulfa antibiotics Other (See Comments) 09/10/2016    Family History  Problem Relation Age of Onset  . Heart disease Mother   . Heart disease Father   . Colon cancer Neg Hx     Social History   Socioeconomic History  . Marital status: Widowed    Spouse name: Not on file  . Number of children: Not on file  . Years of education: Not on file  . Highest education level: Not on file  Occupational History  . Not on file  Tobacco Use  . Smoking status: Never Smoker  . Smokeless tobacco: Never Used  Vaping Use  . Vaping Use: Never used  Substance and Sexual Activity  . Alcohol use: No  . Drug use: No  . Sexual activity: Not on file  Other Topics Concern  . Not on file  Social History Narrative   Veteran: Corporate treasurer   Social Determinants of Health   Financial Resource Strain:   . Difficulty of Paying Living Expenses: Not on file  Food Insecurity:   . Worried About Charity fundraiser in the Last Year: Not on file  . Ran Out of Food in the Last Year: Not on file  Transportation Needs:   . Lack of Transportation (Medical): Not on file  . Lack of Transportation (Non-Medical): Not on file  Physical Activity:   . Days of Exercise per Week: Not on file  . Minutes of Exercise per Session: Not on file  Stress:   . Feeling of Stress : Not on file  Social Connections:   . Frequency of Communication with Friends and Family: Not on file  . Frequency of Social Gatherings with Friends and Family: Not on file  . Attends Religious Services: Not on file  . Active Member of Clubs or Organizations: Not on file  . Attends Archivist Meetings: Not on file  . Marital Status: Not on file    Subjective: Review of Systems  Constitutional: Negative for chills, fever, malaise/fatigue and weight loss.  HENT: Negative for congestion and sore throat.   Respiratory: Negative for cough and shortness of breath.     Cardiovascular: Negative for chest pain and palpitations.  Gastrointestinal: Negative for abdominal pain, blood in stool, constipation, diarrhea, heartburn, melena, nausea and vomiting.  Musculoskeletal: Negative for joint pain and myalgias.  Skin: Negative for rash.  Neurological: Negative for dizziness and weakness.  Endo/Heme/Allergies: Does not bruise/bleed easily.  Psychiatric/Behavioral: Negative for depression. The patient is not nervous/anxious.   All other systems reviewed and are negative.    Objective: BP (!) 146/93   Pulse 76   Temp (!) 96.9 F (36.1 C)   Ht _0  (1.956 m)   Wt 212  lb 6.4 oz (96.3 kg)   BMI 25.19 kg/m  Physical Exam Vitals and nursing note reviewed.  Constitutional:      General: He is not in acute distress.    Appearance: Normal appearance. He is normal weight. He is not ill-appearing, toxic-appearing or diaphoretic.  HENT:     Head: Normocephalic and atraumatic.     Nose: No congestion or rhinorrhea.  Eyes:     General: No scleral icterus. Cardiovascular:     Rate and Rhythm: Normal rate and regular rhythm.     Heart sounds: Normal heart sounds.  Pulmonary:     Effort: Pulmonary effort is normal.     Breath sounds: Normal breath sounds.  Abdominal:     General: Bowel sounds are normal. There is no distension.     Palpations: Abdomen is soft. There is no hepatomegaly, splenomegaly or mass.     Tenderness: There is no abdominal tenderness. There is no guarding or rebound.     Hernia: No hernia is present.  Musculoskeletal:     Cervical back: Neck supple.  Skin:    General: Skin is warm and dry.     Coloration: Skin is not jaundiced.     Findings: No bruising or rash.  Neurological:     General: No focal deficit present.     Mental Status: He is alert and oriented to person, place, and time. Mental status is at baseline.  Psychiatric:        Mood and Affect: Mood normal.        Behavior: Behavior normal.        Thought Content:  Thought content normal.      Assessment:  Very pleasant 69 year old male who presents to schedule a colonoscopy.  His previous colonoscopy was canceled due to COVID-19/coronavirus pandemic.  He is generally asymptomatic from a GI standpoint.  At some point there is an indication that he had heme positive stool.  However, he states he was never told there was actual blood in your he states that his sample was "looking a little suspect" but they never confirmed blood.  At this point we will proceed with colonoscopy for screening exam.  Proceed with TCS with Dr. Abbey Chatters on propofol/MAC in near future: the risks, benefits, and alternatives have been discussed with the patient in detail. The patient states understanding and desires to proceed.  ASA III (CVA 2018)  The patient is not on any anticoagulants, anxiolytics, chronic pain medications, antidepressants, antidiabetics, or iron supplements.  He does have as needed Viagra.  I have advised him to not take Viagra for least 1 week prior to his procedure.   Plan: 1. Colonoscopy on propofol/MAC 2. Further recommendations will follow 3. Call for any problems or questions    Thank you for allowing [place] to participate in the care of [place], DNP, AGNP-C Adult & Gerontological Nurse Practitioner [facility]   08/28/2020 3:07 PM   Disclaimer: This note was dictated with voice recognition software. Similar sounding words can inadvertently be transcribed and may not be corrected upon review.

## 2020-09-13 ENCOUNTER — Telehealth: Payer: Self-pay

## 2020-09-13 NOTE — Telephone Encounter (Signed)
Pt called office and LMOVM to call him. Tried to call pt, no answer, LMOVM to return call.

## 2020-10-03 NOTE — Patient Instructions (Signed)
Wesley Gregory  10/03/2020     @PREFPERIOPPHARMACY @   Your procedure is scheduled on  10/09/2020.  Report to [name] at  1145  A.M.  Call this number if you have problems the morning of surgery:  [phone]   Remember:  Follow the diet and prep instructions given to you by the office.                       Take these medicines the morning of surgery with A SIP OF WATER  Buspar, zyrtec, prilosec, tramadol(if needed).    Do not wear jewelry, make-up or nail polish.  Do not wear lotions, powders, or perfumes. Please wear deodorant and brush your teeth.  Do not shave 48 hours prior to surgery.  Men may shave face and neck.  Do not bring valuables to the hospital.  [facility] is not responsible for any belongings or valuables.  Contacts, dentures or bridgework may not be worn into surgery.  Leave your suitcase in the car.  After surgery it may be brought to your room.  For patients admitted to the hospital, discharge time will be determined by your treatment team.  Patients discharged the day of surgery will not be allowed to drive home.   Name and phone number of your driver:   family Special instructions:  DO NOT smoke the morning of your procedure.  Please read over the following fact sheets that you were given. Anesthesia Post-op Instructions and Care and Recovery After Surgery       Colonoscopy, Adult, Care After This sheet gives you information about how to care for yourself after your procedure. Your health care provider may also give you more specific instructions. If you have problems or questions, contact your health care provider. What can I expect after the procedure? After the procedure, it is common to have:  A small amount of blood in your stool for 24 hours after the procedure.  Some gas.  Mild cramping or bloating of your abdomen. Follow these instructions at home: Eating and drinking   Drink enough fluid to keep your urine pale  yellow.  Follow instructions from your health care provider about eating or drinking restrictions.  Resume your normal diet as instructed by your health care provider. Avoid heavy or fried foods that are hard to digest. Activity  Rest as told by your health care provider.  Avoid sitting for a long time without moving. Get up to take short walks every 1-2 hours. This is important to improve blood flow and breathing. Ask for help if you feel weak or unsteady.  Return to your normal activities as told by your health care provider. Ask your health care provider what activities are safe for you. Managing cramping and bloating   Try walking around when you have cramps or feel bloated.  Apply heat to your abdomen as told by your health care provider. Use the heat source that your health care provider recommends, such as a moist heat pack or a heating pad. ? Place a towel between your skin and the heat source. ? Leave the heat on for 20-30 minutes. ? Remove the heat if your skin turns bright red. This is especially important if you are unable to feel pain, heat, or cold. You may have a greater risk of getting burned. General instructions  For the first 24 hours after the procedure: ? Do not drive or use machinery. ?  Do not sign important documents. ? Do not drink alcohol. ? Do your regular daily activities at a slower pace than normal. ? Eat soft foods that are easy to digest.  Take over-the-counter and prescription medicines only as told by your health care provider.  Keep all follow-up visits as told by your health care provider. This is important. Contact a health care provider if:  You have blood in your stool 2-3 days after the procedure. Get help right away if you have:  More than a small spotting of blood in your stool.  Large blood clots in your stool.  Swelling of your abdomen.  Nausea or vomiting.  A fever.  Increasing pain in your abdomen that is not relieved with  medicine. Summary  After the procedure, it is common to have a small amount of blood in your stool. You may also have mild cramping and bloating of your abdomen.  For the first 24 hours after the procedure, do not drive or use machinery, sign important documents, or drink alcohol.  Get help right away if you have a lot of blood in your stool, nausea or vomiting, a fever, or increased pain in your abdomen. This information is not intended to replace advice given to you by your health care provider. Make sure you discuss any questions you have with your health care provider. Document Revised: 05/16/2019 Document Reviewed: 05/16/2019 Elsevier Patient Education  [place] After These instructions provide you with information about caring for yourself after your procedure. Your health care provider may also give you more specific instructions. Your treatment has been planned according to current medical practices, but problems sometimes occur. Call your health care provider if you have any problems or questions after your procedure. What can I expect after the procedure? After your procedure, you may:  Feel sleepy for several hours.  Feel clumsy and have poor balance for several hours.  Feel forgetful about what happened after the procedure.  Have poor judgment for several hours.  Feel nauseous or vomit.  Have a sore throat if you had a breathing tube during the procedure. Follow these instructions at home: For at least 24 hours after the procedure:      Have a responsible adult stay with you. It is important to have someone help care for you until you are awake and alert.  Rest as needed.  Do not: ? Participate in activities in which you could fall or become injured. ? Drive. ? Use heavy machinery. ? Drink alcohol. ? Take sleeping pills or medicines that cause drowsiness. ? Make important decisions or sign legal documents. ? Take care  of children on your own. Eating and drinking  Follow the diet that is recommended by your health care provider.  If you vomit, drink water, juice, or soup when you can drink without vomiting.  Make sure you have little or no nausea before eating solid foods. General instructions  Take over-the-counter and prescription medicines only as told by your health care provider.  If you have sleep apnea, surgery and certain medicines can increase your risk for breathing problems. Follow instructions from your health care provider about wearing your sleep device: ? Anytime you are sleeping, including during daytime naps. ? While taking prescription pain medicines, sleeping medicines, or medicines that make you drowsy.  If you smoke, do not smoke without supervision.  Keep all follow-up visits as told by your health care provider. This is important. Contact a health  care provider if:  You keep feeling nauseous or you keep vomiting.  You feel light-headed.  You develop a rash.  You have a fever. Get help right away if:  You have trouble breathing. Summary  For several hours after your procedure, you may feel sleepy and have poor judgment.  Have a responsible adult stay with you for at least 24 hours or until you are awake and alert. This information is not intended to replace advice given to you by your health care provider. Make sure you discuss any questions you have with your health care provider. Document Revised: 01/18/2018 Document Reviewed: 02/10/2016 Elsevier Patient Education  [place].

## 2020-10-05 ENCOUNTER — Encounter (HOSPITAL_COMMUNITY)
Admission: RE | Admit: 2020-10-05 | Discharge: 2020-10-05 | Disposition: A | Discharge status: Home / Self Care | Payer: No Typology Code available for payment source | Source: Ambulatory Visit | Attending: Internal Medicine | Admitting: Internal Medicine

## 2020-10-05 ENCOUNTER — Other Ambulatory Visit: Payer: Self-pay

## 2020-10-05 ENCOUNTER — Encounter (HOSPITAL_COMMUNITY): Payer: Self-pay

## 2020-10-05 ENCOUNTER — Other Ambulatory Visit (HOSPITAL_COMMUNITY)
Admission: RE | Admit: 2020-10-05 | Discharge: 2020-10-05 | Disposition: A | Discharge status: Home / Self Care | Payer: No Typology Code available for payment source | Source: Ambulatory Visit | Attending: Internal Medicine | Admitting: Internal Medicine

## 2020-10-05 DIAGNOSIS — Z20822 Contact with and (suspected) exposure to covid-19: Secondary | ICD-10-CM | POA: Insufficient documentation

## 2020-10-05 DIAGNOSIS — Z01818 Encounter for other preprocedural examination: Secondary | ICD-10-CM | POA: Diagnosis present

## 2020-10-05 LAB — BASIC METABOLIC PANEL
Anion gap: 11 (ref 5–15)
BUN: 18 mg/dL (ref 8–23)
CO2: 26 mmol/L (ref 22–32)
Calcium: 9.4 mg/dL (ref 8.9–10.3)
Chloride: 103 mmol/L (ref 98–111)
Creatinine, Ser: 0.91 mg/dL (ref 0.61–1.24)
GFR, Estimated: >60 mL/min (ref >60)
Glucose, Bld: 122 mg/dL — ABNORMAL HIGH (ref 70–99)
Potassium: 3.1 mmol/L — ABNORMAL LOW (ref 3.5–5.1)
Sodium: 140 mmol/L (ref 135–145)

## 2020-10-06 LAB — SARS CORONAVIRUS 2 (TAT 6-24 HRS): SARS Coronavirus 2: NEGATIVE

## 2020-10-09 ENCOUNTER — Encounter (HOSPITAL_COMMUNITY)
Admission: RE | Disposition: A | Discharge status: Home / Self Care | Payer: Self-pay | Source: Home / Self Care | Attending: Internal Medicine

## 2020-10-09 ENCOUNTER — Encounter (HOSPITAL_COMMUNITY): Payer: Self-pay

## 2020-10-09 ENCOUNTER — Ambulatory Visit (HOSPITAL_COMMUNITY): Payer: No Typology Code available for payment source | Admitting: Anesthesiology

## 2020-10-09 ENCOUNTER — Ambulatory Visit (HOSPITAL_COMMUNITY)
Admission: RE | Admit: 2020-10-09 | Discharge: 2020-10-09 | Disposition: A | Discharge status: Home / Self Care | Payer: No Typology Code available for payment source | Attending: Internal Medicine | Admitting: Internal Medicine

## 2020-10-09 DIAGNOSIS — Z7982 Long term (current) use of aspirin: Secondary | ICD-10-CM | POA: Insufficient documentation

## 2020-10-09 DIAGNOSIS — K648 Other hemorrhoids: Secondary | ICD-10-CM | POA: Diagnosis not present

## 2020-10-09 DIAGNOSIS — Z8673 Personal history of transient ischemic attack (TIA), and cerebral infarction without residual deficits: Secondary | ICD-10-CM | POA: Diagnosis not present

## 2020-10-09 DIAGNOSIS — Z09 Encounter for follow-up examination after completed treatment for conditions other than malignant neoplasm: Secondary | ICD-10-CM | POA: Insufficient documentation

## 2020-10-09 DIAGNOSIS — Z86718 Personal history of other venous thrombosis and embolism: Secondary | ICD-10-CM | POA: Insufficient documentation

## 2020-10-09 DIAGNOSIS — K635 Polyp of colon: Secondary | ICD-10-CM | POA: Diagnosis not present

## 2020-10-09 DIAGNOSIS — D124 Benign neoplasm of descending colon: Secondary | ICD-10-CM | POA: Insufficient documentation

## 2020-10-09 DIAGNOSIS — Z86010 Personal history of colonic polyps: Secondary | ICD-10-CM | POA: Diagnosis not present

## 2020-10-09 DIAGNOSIS — Z79899 Other long term (current) drug therapy: Secondary | ICD-10-CM | POA: Insufficient documentation

## 2020-10-09 DIAGNOSIS — K5730 Diverticulosis of large intestine without perforation or abscess without bleeding: Secondary | ICD-10-CM | POA: Insufficient documentation

## 2020-10-09 DIAGNOSIS — Z882 Allergy status to sulfonamides status: Secondary | ICD-10-CM | POA: Diagnosis not present

## 2020-10-09 HISTORY — PX: COLONOSCOPY WITH PROPOFOL: SHX5780

## 2020-10-09 HISTORY — PX: POLYPECTOMY: SHX5525

## 2020-10-09 SURGERY — COLONOSCOPY WITH PROPOFOL
Anesthesia: General

## 2020-10-09 MED ORDER — PROPOFOL 500 MG/50ML IV EMUL
INTRAVENOUS | Status: DC | PRN
  Administered 2020-10-09: 100 ug/kg/min via INTRAVENOUS

## 2020-10-09 MED ORDER — LACTATED RINGERS IV SOLN
Freq: Once | INTRAVENOUS | Status: AC
  Administered 2020-10-09: 1000 mL via INTRAVENOUS

## 2020-10-09 MED ORDER — LACTATED RINGERS IV SOLN: INTRAVENOUS | Status: DC | PRN

## 2020-10-09 MED ORDER — PROPOFOL 10 MG/ML IV BOLUS
INTRAVENOUS | Status: DC | PRN
  Administered 2020-10-09: 100 mg via INTRAVENOUS

## 2020-10-09 MED ORDER — LIDOCAINE HCL (CARDIAC) PF 100 MG/5ML IV SOSY
PREFILLED_SYRINGE | INTRAVENOUS | Status: DC | PRN
  Administered 2020-10-09: 50 mg via INTRAVENOUS

## 2020-10-09 NOTE — Anesthesia Postprocedure Evaluation (Signed)
Anesthesia Post Note  Patient: Wesley Gregory  Procedure(s) Performed: COLONOSCOPY WITH PROPOFOL (N/A ) POLYPECTOMY  Patient location during evaluation: PACU Anesthesia Type: General Level of consciousness: awake and alert and oriented Pain management: pain level controlled Vital Signs Assessment: post-procedure vital signs reviewed and stable Respiratory status: spontaneous breathing, nonlabored ventilation and respiratory function stable Cardiovascular status: blood pressure returned to baseline and stable Postop Assessment: no apparent nausea or vomiting Anesthetic complications: no   No complications documented.   Last Vitals:  Vitals:   10/09/20 1240 10/09/20 1245  BP: 115/84 (!) 113/92  Pulse: 88 91  Resp: 16 12  Temp: 36.6 C   SpO2: 99% 98%    Last Pain:  Vitals:   10/09/20 1240  TempSrc:   PainSc: [place]

## 2020-10-09 NOTE — Op Note (Signed)
[facility] Patient Name: Wesley Gregory Procedure Date: 10/09/2020 11:57 AM MRN: [id number] Date of Birth: 06/27/1951 Attending MD: Elon Alas. Abbey Chatters DO CSN: [id number] Age: 69 Admit Type: Outpatient Procedure:                Colonoscopy Indications:              High risk colon cancer surveillance: Personal                            history of colonic polyps Providers:                Elon Alas. Anastasios Melander, DO, Otis Peak B. Sharon Seller, RN,                            Caprice Kluver, Aram Candela Referring MD:              Medicines:                See the Anesthesia note for documentation of the                            administered medications Complications:            No immediate complications. Estimated Blood Loss:     Estimated blood loss was minimal. Procedure:                Pre-Anesthesia Assessment:                           - The anesthesia plan was to use monitored                            anesthesia care (MAC).                           After obtaining informed consent, the colonoscope                            was passed under direct vision. Throughout the                            procedure, the patient's blood pressure, pulse, and                            oxygen saturations were monitored continuously. The                            PCF-[id number] ([id number]) scope was introduced through                            the anus and advanced to the the cecum, identified                            by appendiceal orifice and ileocecal valve. The                            colonoscopy was performed without  difficulty. The                            patient tolerated the procedure well. The quality                            of the bowel preparation was evaluated using the                            BBPS [facility] Bowel Preparation Scale) with scores                            of: Right Colon = 2 (minor amount of residual                            staining, small fragments of stool  and/or opaque                            liquid, but mucosa seen well), Transverse Colon = 2                            (minor amount of residual staining, small fragments                            of stool and/or opaque liquid, but mucosa seen                            well) and Left Colon = 2 (minor amount of residual                            staining, small fragments of stool and/or opaque                            liquid, but mucosa seen well). The total BBPS score                            equals 6. The quality of the bowel preparation was                            fair. Scope In: 12:24:05 PM Scope Out: 16:10:96 PM Scope Withdrawal Time: 0 hours 9 minutes 15 seconds  Total Procedure Duration: 0 hours 12 minutes 14 seconds  Findings:      The perianal and digital rectal examinations were normal.      Non-bleeding internal hemorrhoids were found during endoscopy.      Multiple small-mouthed diverticula were found in the sigmoid colon.      A 2 mm polyp was found in the descending colon. The polyp was sessile.       The polyp was removed with a jumbo cold forceps. Resection and retrieval       were complete.      The exam was otherwise without abnormality. Impression:               - Preparation of the colon was fair.                           -  Non-bleeding internal hemorrhoids.                           - Diverticulosis in the sigmoid colon.                           - One 2 mm polyp in the descending colon, removed                            with a jumbo cold forceps. Resected and retrieved.                           - The examination was otherwise normal. Moderate Sedation:      Per Anesthesia Care Recommendation:           - Patient has a contact number available for                            emergencies. The signs and symptoms of potential                            delayed complications were discussed with the                            patient. Return to normal  activities tomorrow.                            Written discharge instructions were provided to the                            patient.                           - Resume previous diet.                           - Continue present medications.                           - Await pathology results.                           - Repeat colonoscopy in 7 years for surveillance.                           - Return to GI clinic PRN. Procedure Code(s):        --- Professional ---                           [phone], Colonoscopy, flexible; with biopsy, single                            or multiple Diagnosis Code(s):        --- Professional ---                           Z86.010, Personal history  of colonic polyps                           K64.8, Other hemorrhoids                           K63.5, Polyp of colon                           K57.30, Diverticulosis of large intestine without                            perforation or abscess without bleeding CPT copyright 2019 American Medical Association. All rights reserved. The codes documented in this report are preliminary and upon coder review may  be revised to meet current compliance requirements. Elon Alas. Abbey Chatters, DO Correll Abbey Chatters, DO 10/09/2020 12:38:36 PM This report has been signed electronically. Number of Addenda: 0

## 2020-10-09 NOTE — Anesthesia Preprocedure Evaluation (Signed)
Anesthesia Evaluation  Patient identified by MRN, date of birth, ID band Patient awake    Reviewed: Allergy & Precautions, NPO status , Patient's Chart, lab work & pertinent test results  History of Anesthesia Complications Negative for: history of anesthetic complications  Airway Mallampati: III  TM Distance: >3 FB Neck ROM: Full   Comment: Mandibular fx, neck sx Dental  (+) Dental Advisory Given, Caps, Missing   Pulmonary shortness of breath,    Pulmonary exam normal breath sounds clear to auscultation       Cardiovascular Exercise Tolerance: Good hypertension, Pt. on medications Normal cardiovascular exam Rhythm:Regular Rate:Normal     Neuro/Psych PSYCHIATRIC DISORDERS Anxiety Depression CVA, No Residual Symptoms    GI/Hepatic Neg liver ROS, GERD  Medicated,  Endo/Other  negative endocrine ROS  Renal/GU negative Renal ROS     Musculoskeletal  (+) Arthritis , Mandibular sx, neck sx   Abdominal   Peds  Hematology negative hematology ROS (+)   Anesthesia Other Findings   Reproductive/Obstetrics                             Anesthesia Physical Anesthesia Plan  ASA: III  Anesthesia Plan: General   Post-op Pain Management:    Induction: Intravenous  PONV Risk Score and Plan: TIVA  Airway Management Planned: Nasal Cannula and Natural Airway  Additional Equipment:   Intra-op Plan:   Post-operative Plan:   Informed Consent: I have reviewed the patients History and Physical, chart, labs and discussed the procedure including the risks, benefits and alternatives for the proposed anesthesia with the patient or authorized representative who has indicated his/her understanding and acceptance.     Dental advisory given  Plan Discussed with: CRNA and Surgeon  Anesthesia Plan Comments:         Anesthesia Quick Evaluation

## 2020-10-09 NOTE — Anesthesia Procedure Notes (Signed)
Date/Time: 10/09/2020 12:26 PM Performed by: Orlie Dakin, CRNA Pre-anesthesia Checklist: Patient identified, Emergency Drugs available, Suction available and Patient being monitored Patient Re-evaluated:Patient Re-evaluated prior to induction Oxygen Delivery Method: Nasal cannula Induction Type: IV induction Placement Confirmation: positive ETCO2

## 2020-10-09 NOTE — H&P (Signed)
Primary Care Physician:  Mila Doce Primary Gastroenterologist:  Dr. Abbey Chatters  Pre-Procedure History & Physical: HPI:  Wesley Gregory is a 69 y.o. male is here for a colonoscopy to be performed for surveillance due to personal history of adenomatous colon polyps.  Patient denies any family history of colorectal cancer.  No melena or hematochezia.  No abdominal pain or unintentional weight loss.  No change in bowel habits.  Overall feels well from a GI standpoint.  Past Medical History:  Diagnosis Date  . Anxiety   . Arthritis   . CVA (cerebral vascular accident) ([place])    report stroke 2018   . Depression   . DVT (deep venous thrombosis) ([place])    after leg injury several years ago  . Dyspnea    with injury  . GERD (gastroesophageal reflux disease)   . Head injury with loss of consciousness ([place])    short loss of consciouness, numerous head injuries  . History of kidney stones    Passed a couple  . Hypertension   . Panic attacks   . PTSD (post-traumatic stress disorder)     Past Surgical History:  Procedure Laterality Date  . ANKLE SURGERY Left    fracture repair- pins  . APPENDECTOMY    . CERVICAL DISC SURGERY  2007   twice  . COLON RESECTION  1980's   abdominal injury with appendectomy  . COLONOSCOPY  2007   Dr. Gala Romney: normal. next one in 2017  . CYSTOSCOPY    . KNEE ARTHROSCOPY Bilateral   . MANDIBLE FRACTURE SURGERY    . SHOULDER ARTHROSCOPY     x 3 rotatoar cuff  . SHOULDER ARTHROSCOPY WITH ROTATOR CUFF REPAIR AND OPEN BICEPS TENODESIS Right 09/16/2016   Procedure: SHOULDER DIAGNOSTIC OPERATIVE ARTHROSCOPY WITH MINI-OPEN ROTATOR CUFF REPAIR AND  BICEPS TENODESIS.;  Surgeon: Meredith Pel, MD;  Location: [facility];  Service: Orthopedics;  Laterality: Right;    Prior to Admission medications   Medication Sig Start Date End Date Taking? Authorizing Provider  aspirin EC 81 MG tablet Take 81 mg by mouth at bedtime.    Yes [provider]   atorvastatin (LIPITOR) 80 MG tablet Take 40 mg by mouth at bedtime.    Yes [provider]  busPIRone (BUSPAR) 10 MG tablet Take 10 mg by mouth 2 (two) times daily.   Yes [provider]  cetirizine (ZYRTEC) 10 MG tablet Take 10 mg by mouth daily as needed for allergies.   Yes [provider]  cholecalciferol (VITAMIN D3) 25 MCG (1000 UNIT) tablet Take 1,000 Units by mouth daily.   Yes [provider]  clindamycin (CLEOCIN T) 1 % lotion Apply 1 application topically 2 (two) times daily as needed (for flare up of hair bumps on neck and scalp).   Yes [provider]  hydrochlorothiazide (HYDRODIURIL) 25 MG tablet Take 25 mg by mouth daily.   Yes [provider]  naproxen sodium (ALEVE) 220 MG tablet Take 440 mg by mouth 2 (two) times daily as needed (pain).    Yes [provider]  omeprazole (PRILOSEC OTC) 20 MG tablet Take 20 mg by mouth daily.   Yes [provider]  sildenafil (VIAGRA) 100 MG tablet Take 100 mg by mouth daily as needed for erectile dysfunction.   Yes [provider]  traMADol (ULTRAM) 50 MG tablet Take 50 mg by mouth every 4 (four) hours as needed for moderate pain.    Yes [provider]  Allergies as of 08/28/2020 - Review Complete 08/28/2020  Allergen Reaction Noted  . Sulfa antibiotics Other (See Comments) 09/10/2016    Family History  Problem Relation Age of Onset  . Heart disease Mother   . Heart disease Father   . Colon cancer Neg Hx     Social History   Socioeconomic History  . Marital status: Widowed    Spouse name: Not on file  . Number of children: Not on file  . Years of education: Not on file  . Highest education level: Not on file  Occupational History  . Not on file  Tobacco Use  . Smoking status: Never Smoker  . Smokeless tobacco: Never Used  Vaping Use  . Vaping Use: Never used  Substance and Sexual Activity  . Alcohol use: No  . Drug use: No  .  Sexual activity: Not on file  Other Topics Concern  . Not on file  Social History Narrative   Veteran: Corporate treasurer   Social Determinants of Health   Financial Resource Strain:   . Difficulty of Paying Living Expenses: Not on file  Food Insecurity:   . Worried About Charity fundraiser in the Last Year: Not on file  . Ran Out of Food in the Last Year: Not on file  Transportation Needs:   . Lack of Transportation (Medical): Not on file  . Lack of Transportation (Non-Medical): Not on file  Physical Activity:   . Days of Exercise per Week: Not on file  . Minutes of Exercise per Session: Not on file  Stress:   . Feeling of Stress : Not on file  Social Connections:   . Frequency of Communication with Friends and Family: Not on file  . Frequency of Social Gatherings with Friends and Family: Not on file  . Attends Religious Services: Not on file  . Active Member of Clubs or Organizations: Not on file  . Attends Archivist Meetings: Not on file  . Marital Status: Not on file  Intimate Partner Violence:   . Fear of Current or Ex-Partner: Not on file  . Emotionally Abused: Not on file  . Physically Abused: Not on file  . Sexually Abused: Not on file    Review of Systems: See HPI, otherwise negative ROS  Impression/Plan: Wesley Gregory is here for a colonoscopy to be performed for surveillance due to personal history of adenomatous colon polyps.   The risks of the procedure including infection, bleed, or perforation as well as benefits, limitations, alternatives and imponderables have been reviewed with the patient. Questions have been answered. All parties agreeable.

## 2020-10-09 NOTE — Discharge Instructions (Addendum)
Colonoscopy Discharge Instructions  Read the instructions outlined below and refer to this sheet in the next few weeks. These discharge instructions provide you with general information on caring for yourself after you leave the hospital. Your doctor may also give you specific instructions. While your treatment has been planned according to the most current medical practices available, unavoidable complications occasionally occur.   ACTIVITY  You may resume your regular activity, but move at a slower pace for the next 24 hours.   Take frequent rest periods for the next 24 hours.   Walking will help get rid of the air and reduce the bloated feeling in your belly (abdomen).   No driving for 24 hours (because of the medicine (anesthesia) used during the test).    Do not sign any important legal documents or operate any machinery for 24 hours (because of the anesthesia used during the test).  NUTRITION  Drink plenty of fluids.   You may resume your normal diet as instructed by your doctor.   Begin with a light meal and progress to your normal diet. Heavy or fried foods are harder to digest and may make you feel sick to your stomach (nauseated).   Avoid alcoholic beverages for 24 hours or as instructed.  MEDICATIONS  You may resume your normal medications unless your doctor tells you otherwise.  WHAT YOU CAN EXPECT TODAY  Some feelings of bloating in the abdomen.   Passage of more gas than usual.   Spotting of blood in your stool or on the toilet paper.  IF YOU HAD POLYPS REMOVED DURING THE COLONOSCOPY:  No aspirin products for 7 days or as instructed.   No alcohol for 7 days or as instructed.   Eat a soft diet for the next 24 hours.  FINDING OUT THE RESULTS OF YOUR TEST Not all test results are available during your visit. If your test results are not back during the visit, make an appointment with your caregiver to find out the results. Do not assume everything is normal if  you have not heard from your caregiver or the medical facility. It is important for you to follow up on all of your test results.  SEEK IMMEDIATE MEDICAL ATTENTION IF:  You have more than a spotting of blood in your stool.   Your belly is swollen (abdominal distention).   You are nauseated or vomiting.   You have a temperature over 101.   You have abdominal pain or discomfort that is severe or gets worse throughout the day.   Your colonoscopy revealed 1 polyp(s) which I removed successfully. Await pathology results, my office will contact you. I recommend repeating colonoscopy in 7 years for surveillance purposes.   You also have diverticulosis and internal hemorrhoids. I would recommend increasing fiber in your diet or adding OTC Benefiber/Metamucil. Be sure to drink at least 4 to 6 glasses of water daily. Follow-up with GI as needed.    I hope you have a great rest of your week!  Elon Alas. Abbey Chatters, D.O. Gastroenterology and Hepatology [facility]   Hemorrhoids Hemorrhoids are swollen veins in and around the rectum or anus. There are two types of hemorrhoids:  Internal hemorrhoids. These occur in the veins that are just inside the rectum. They may poke through to the outside and become irritated and painful.  External hemorrhoids. These occur in the veins that are outside the anus and can be felt as a painful swelling or hard lump near the anus.  Most hemorrhoids do not cause serious problems, and they can be managed with home treatments such as diet and lifestyle changes. If home treatments do not help the symptoms, procedures can be done to shrink or remove the hemorrhoids. What are the causes? This condition is caused by increased pressure in the anal area. This pressure may result from various things, including:  Constipation.  Straining to have a bowel movement.  Diarrhea.  Pregnancy.  Obesity.  Sitting for long periods of time.  Heavy  lifting or other activity that causes you to strain.  Anal sex.  Riding a bike for a long period of time. What are the signs or symptoms? Symptoms of this condition include:  Pain.  Anal itching or irritation.  Rectal bleeding.  Leakage of stool (feces).  Anal swelling.  One or more lumps around the anus. How is this diagnosed? This condition can often be diagnosed through a visual exam. Other exams or tests may also be done, such as:  An exam that involves feeling the rectal area with a gloved hand (digital rectal exam).  An exam of the anal canal that is done using a small tube (anoscope).  A blood test, if you have lost a significant amount of blood.  A test to look inside the colon using a flexible tube with a camera on the end (sigmoidoscopy or colonoscopy). How is this treated? This condition can usually be treated at home. However, various procedures may be done if dietary changes, lifestyle changes, and other home treatments do not help your symptoms. These procedures can help make the hemorrhoids smaller or remove them completely. Some of these procedures involve surgery, and others do not. Common procedures include:  Rubber band ligation. Rubber bands are placed at the base of the hemorrhoids to cut off their blood supply.  Sclerotherapy. Medicine is injected into the hemorrhoids to shrink them.  Infrared coagulation. A type of light energy is used to get rid of the hemorrhoids.  Hemorrhoidectomy surgery. The hemorrhoids are surgically removed, and the veins that supply them are tied off.  Stapled hemorrhoidopexy surgery. The surgeon staples the base of the hemorrhoid to the rectal wall. Follow these instructions at home: Eating and drinking   Eat foods that have a lot of fiber in them, such as whole grains, beans, nuts, fruits, and vegetables.  Ask your health care provider about taking products that have added fiber (fiber supplements).  Reduce the amount  of fat in your diet. You can do this by eating low-fat dairy products, eating less red meat, and avoiding processed foods.  Drink enough fluid to keep your urine pale yellow. Managing pain and swelling   Take warm sitz baths for 20 minutes, 3-4 times a day to ease pain and discomfort. You may do this in a bathtub or using a portable sitz bath that fits over the toilet.  If directed, apply ice to the affected area. Using ice packs between sitz baths may be helpful. ? Put ice in a plastic bag. ? Place a towel between your skin and the bag. ? Leave the ice on for 20 minutes, 2-3 times a day. General instructions  Take over-the-counter and prescription medicines only as told by your health care provider.  Use medicated creams or suppositories as told.  Get regular exercise. Ask your health care provider how much and what kind of exercise is best for you. In general, you should do moderate exercise for at least 30 minutes on most days of  the week (150 minutes each week). This can include activities such as walking, biking, or yoga.  Go to the bathroom when you have the urge to have a bowel movement. Do not wait.  Avoid straining to have bowel movements.  Keep the anal area dry and clean. Use wet toilet paper or moist towelettes after a bowel movement.  Do not sit on the toilet for long periods of time. This increases blood pooling and pain.  Keep all follow-up visits as told by your health care provider. This is important. Contact a health care provider if you have:  Increasing pain and swelling that are not controlled by treatment or medicine.  Difficulty having a bowel movement, or you are unable to have a bowel movement.  Pain or inflammation outside the area of the hemorrhoids. Get help right away if you have:  Uncontrolled bleeding from your rectum. Summary  Hemorrhoids are swollen veins in and around the rectum or anus.  Most hemorrhoids can be managed with home treatments  such as diet and lifestyle changes.  Taking warm sitz baths can help ease pain and discomfort.  In severe cases, procedures or surgery can be done to shrink or remove the hemorrhoids. This information is not intended to replace advice given to you by your health care provider. Make sure you discuss any questions you have with your health care provider. Document Revised: 03/18/2019 Document Reviewed: 03/11/2018 Elsevier Patient Education  [place].  Diverticulosis  Diverticulosis is a condition that develops when small pouches (diverticula) form in the wall of the large intestine (colon). The colon is where water is absorbed and stool (feces) is formed. The pouches form when the inside layer of the colon pushes through weak spots in the outer layers of the colon. You may have a few pouches or many of them. The pouches usually do not cause problems unless they become inflamed or infected. When this happens, the condition is called diverticulitis. What are the causes? The cause of this condition is not known. What increases the risk? The following factors may make you more likely to develop this condition:  Being older than age 75. Your risk for this condition increases with age. Diverticulosis is rare among people younger than age 32. By age 48, many people have it.  Eating a low-fiber diet.  Having frequent constipation.  Being overweight.  Not getting enough exercise.  Smoking.  Taking over-the-counter pain medicines, like aspirin and ibuprofen.  Having a family history of diverticulosis. What are the signs or symptoms? In most people, there are no symptoms of this condition. If you do have symptoms, they may include:  Bloating.  Cramps in the abdomen.  Constipation or diarrhea.  Pain in the lower left side of the abdomen. How is this diagnosed? Because diverticulosis usually has no symptoms, it is most often diagnosed during an exam for other colon problems. The  condition may be diagnosed by:  Using a flexible scope to examine the colon (colonoscopy).  Taking an X-ray of the colon after dye has been put into the colon (barium enema).  Having a CT scan. How is this treated? You may not need treatment for this condition. Your health care provider may recommend treatment to prevent problems. You may need treatment if you have symptoms or if you previously had diverticulitis. Treatment may include:  Eating a high-fiber diet.  Taking a fiber supplement.  Taking a live bacteria supplement (probiotic).  Taking medicine to relax your colon. Follow these  instructions at home: Medicines  Take over-the-counter and prescription medicines only as told by your health care provider.  If told by your health care provider, take a fiber supplement or probiotic. Constipation prevention Your condition may cause constipation. To prevent or treat constipation, you may need to:  Drink enough fluid to keep your urine pale yellow.  Take over-the-counter or prescription medicines.  Eat foods that are high in fiber, such as beans, whole grains, and fresh fruits and vegetables.  Limit foods that are high in fat and processed sugars, such as fried or sweet foods.  General instructions  Try not to strain when you have a bowel movement.  Keep all follow-up visits as told by your health care provider. This is important. Contact a health care provider if you:  Have pain in your abdomen.  Have bloating.  Have cramps.  Have not had a bowel movement in 3 days. Get help right away if:  Your pain gets worse.  Your bloating becomes very bad.  You have a fever or chills, and your symptoms suddenly get worse.  You vomit.  You have bowel movements that are bloody or black.  You have bleeding from your rectum. Summary  Diverticulosis is a condition that develops when small pouches (diverticula) form in the wall of the large intestine (colon).  You may  have a few pouches or many of them.  This condition is most often diagnosed during an exam for other colon problems.  Treatment may include increasing the fiber in your diet, taking supplements, or taking medicines. This information is not intended to replace advice given to you by your health care provider. Make sure you discuss any questions you have with your health care provider. Document Revised: 05/19/2019 Document Reviewed: 05/19/2019 Elsevier Patient Education  [place].

## 2020-10-09 NOTE — Transfer of Care (Signed)
Immediate Anesthesia Transfer of Care Note  Patient: Wesley Gregory  Procedure(s) Performed: COLONOSCOPY WITH PROPOFOL (N/A ) POLYPECTOMY  Patient Location: PACU  Anesthesia Type:General  Level of Consciousness: awake, alert  and oriented  Airway & Oxygen Therapy: Patient Spontanous Breathing  Post-op Assessment: Report given to RN and Post -op Vital signs reviewed and stable  Post vital signs: Reviewed and stable  Last Vitals:  Vitals Value Taken Time  BP 115/84 10/09/20 1242  Temp    Pulse 86 10/09/20 1243  Resp 17 10/09/20 1243  SpO2 98 % 10/09/20 1243  Vitals shown include unvalidated device data.  Last Pain:  Vitals:   10/09/20 1221  TempSrc:   PainSc: 6       Patients Stated Pain Goal: 8 (18/55/01 5868)  Complications: No complications documented.

## 2020-10-11 LAB — SURGICAL PATHOLOGY

## 2020-10-15 ENCOUNTER — Encounter (HOSPITAL_COMMUNITY): Payer: Self-pay | Admitting: Internal Medicine

## 2020-10-18 ENCOUNTER — Telehealth: Payer: Self-pay | Admitting: *Deleted

## 2020-10-18 NOTE — Telephone Encounter (Signed)
Pt called back.  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 Informed him that we will need to repeat colonoscopy in 7 years as discussed postoperatively with Dr. Abbey Chatters. Pt informed to follow-up with [place] as needed.  Pt voiced understanding.  Zeb Comfort:  Can you NIC for 7 year repeat colonoscopy please?

## 2020-10-18 NOTE — Telephone Encounter (Signed)
Noted  

## 2020-10-19 NOTE — Telephone Encounter (Signed)
Reminder in epic °

## 2020-11-27 ENCOUNTER — Telehealth: Payer: Self-pay

## 2020-11-27 NOTE — Telephone Encounter (Signed)
Received a call from [place] at the [place]. They referred pt and would like a copy of pts ov note and procedure results faxed to [phone].

## 2020-11-28 NOTE — Telephone Encounter (Signed)
done

## 2021-11-10 ENCOUNTER — Encounter (HOSPITAL_COMMUNITY): Payer: Self-pay | Admitting: *Deleted

## 2021-11-10 ENCOUNTER — Other Ambulatory Visit: Payer: Self-pay

## 2021-11-10 ENCOUNTER — Emergency Department (HOSPITAL_COMMUNITY)
Admission: EM | Admit: 2021-11-10 | Discharge: 2021-11-10 | Disposition: A | Discharge status: Home / Self Care | Payer: No Typology Code available for payment source | Attending: Emergency Medicine | Admitting: Emergency Medicine

## 2021-11-10 DIAGNOSIS — Z7982 Long term (current) use of aspirin: Secondary | ICD-10-CM | POA: Insufficient documentation

## 2021-11-10 DIAGNOSIS — N3091 Cystitis, unspecified with hematuria: Secondary | ICD-10-CM | POA: Diagnosis not present

## 2021-11-10 DIAGNOSIS — Z79899 Other long term (current) drug therapy: Secondary | ICD-10-CM | POA: Insufficient documentation

## 2021-11-10 DIAGNOSIS — N3090 Cystitis, unspecified without hematuria: Secondary | ICD-10-CM

## 2021-11-10 DIAGNOSIS — R319 Hematuria, unspecified: Secondary | ICD-10-CM | POA: Diagnosis present

## 2021-11-10 LAB — CBC WITH DIFFERENTIAL/PLATELET
Abs Immature Granulocytes: 0.02 10*3/uL (ref 0.00–0.07)
Basophils Absolute: 0 10*3/uL (ref 0.0–0.1)
Basophils Relative: 0 %
Eosinophils Absolute: 0.1 10*3/uL (ref 0.0–0.5)
Eosinophils Relative: 1 %
HCT: 42.1 % (ref 39.0–52.0)
Hemoglobin: 14.5 g/dL (ref 13.0–17.0)
Immature Granulocytes: 0 %
Lymphocytes Relative: 19 %
Lymphs Abs: 1.4 10*3/uL (ref 0.7–4.0)
MCH: 31 pg (ref 26.0–34.0)
MCHC: 34.4 g/dL (ref 30.0–36.0)
MCV: 90.1 fL (ref 80.0–100.0)
Monocytes Absolute: 0.5 10*3/uL (ref 0.1–1.0)
Monocytes Relative: 7 %
Neutro Abs: 5.6 10*3/uL (ref 1.7–7.7)
Neutrophils Relative %: 73 %
Platelets: 206 10*3/uL (ref 150–400)
RBC: 4.67 MIL/uL (ref 4.22–5.81)
RDW: 13 % (ref 11.5–15.5)
WBC: 7.6 10*3/uL (ref 4.0–10.5)
nRBC: 0 % (ref 0.0–0.2)

## 2021-11-10 LAB — BASIC METABOLIC PANEL
Anion gap: 10 (ref 5–15)
BUN: 21 mg/dL (ref 8–23)
CO2: 30 mmol/L (ref 22–32)
Calcium: 9.2 mg/dL (ref 8.9–10.3)
Chloride: 102 mmol/L (ref 98–111)
Creatinine, Ser: 1.14 mg/dL (ref 0.61–1.24)
GFR, Estimated: >60 mL/min (ref >60)
Glucose, Bld: 144 mg/dL — ABNORMAL HIGH (ref 70–99)
Potassium: 3.8 mmol/L (ref 3.5–5.1)
Sodium: 142 mmol/L (ref 135–145)

## 2021-11-10 LAB — URINALYSIS, ROUTINE W REFLEX MICROSCOPIC
Bacteria, UA: NONE SEEN
Bilirubin Urine: NEGATIVE
Glucose, UA: NEGATIVE mg/dL
Hgb urine dipstick: NEGATIVE
Ketones, ur: 5 mg/dL — AB
Nitrite: NEGATIVE
Protein, ur: NEGATIVE mg/dL
Specific Gravity, Urine: 1.026 (ref 1.005–1.030)
pH: 5 (ref 5.0–8.0)

## 2021-11-10 LAB — PROTIME-INR
INR: 0.9 (ref 0.8–1.2)
Prothrombin Time: 12.6 seconds (ref 11.4–15.2)

## 2021-11-10 MED ORDER — STERILE WATER FOR INJECTION IJ SOLN
INTRAMUSCULAR | Status: AC
  Administered 2021-11-10: 1 mL
  Filled 2021-11-10: qty 10

## 2021-11-10 MED ORDER — CEPHALEXIN 500 MG PO CAPS: 500.0000 mg | ORAL_CAPSULE | Freq: Two times a day (BID) | ORAL | 0 refills | Status: AC

## 2021-11-10 MED ORDER — PHENAZOPYRIDINE HCL 200 MG PO TABS: 200.0000 mg | ORAL_TABLET | Freq: Three times a day (TID) | ORAL | 0 refills | Status: AC

## 2021-11-10 MED ORDER — PHENAZOPYRIDINE HCL 100 MG PO TABS
200.0000 mg | ORAL_TABLET | Freq: Once | ORAL | Status: AC
  Administered 2021-11-10: 200 mg via ORAL
  Filled 2021-11-10: qty 2

## 2021-11-10 MED ORDER — CEFTRIAXONE SODIUM 500 MG IJ SOLR
500.0000 mg | Freq: Once | INTRAMUSCULAR | Status: AC
  Administered 2021-11-10: 500 mg via INTRAMUSCULAR
  Filled 2021-11-10: qty 500

## 2021-11-10 MED ORDER — ACETAMINOPHEN 500 MG PO TABS: 500.0000 mg | ORAL_TABLET | Freq: Four times a day (QID) | ORAL | 0 refills | Status: AC | PRN

## 2021-11-10 NOTE — ED Triage Notes (Signed)
Pt with blood noted in urine yesterday, none today.  Pt with new onset urine incontinence.  + burning with urination. Denies any fevers or N/V

## 2021-11-10 NOTE — Discharge Instructions (Addendum)
Please take the antibiotics that are prescribed, follow-up with your primary care doctor as discussed.  Return to the ER immediately restart having severe nausea, vomiting, severe back pain with fevers and chills.

## 2021-11-10 NOTE — ED Provider Notes (Signed)
[facility] EMERGENCY DEPARTMENT Provider Note   CSN: [id number] Arrival date & time: 11/10/21  1306     History  Chief Complaint  Patient presents with   Hematuria    Wesley Gregory is a 71 y.o. male.  HPI     71 year old male comes in with chief complaint of burning with urination and blood in the urine  Patient started having some burning with urination 2 days ago.  Yesterday he noted some blood in the urine.  Today there was less blood in the urine.  He denies any history of kidney stone, but does have history of UTI.  He took 2 Bactrim's yesterday.  Review of system is negative for any fevers, chills, back pain, flank pain, nausea, vomiting.   Patient has been able to void, in fact states that he is having urge incontinence like symptoms.  Home Medications Prior to Admission medications   Medication Sig Start Date End Date Taking? Authorizing Provider  acetaminophen (TYLENOL) 500 MG tablet Take 1 tablet (500 mg total) by mouth every 6 (six) hours as needed. 11/10/21  Yes Layman Gully, MD  cephALEXin (KEFLEX) 500 MG capsule Take 1 capsule (500 mg total) by mouth 2 (two) times daily. 11/10/21  Yes Varney Biles, MD  phenazopyridine (PYRIDIUM) 200 MG tablet Take 1 tablet (200 mg total) by mouth 3 (three) times daily. 11/10/21  Yes Varney Biles, MD  aspirin EC 81 MG tablet Take 81 mg by mouth at bedtime.     [provider]  atorvastatin (LIPITOR) 80 MG tablet Take 40 mg by mouth at bedtime.     [provider]  busPIRone (BUSPAR) 10 MG tablet Take 10 mg by mouth 2 (two) times daily.    [provider]  cetirizine (ZYRTEC) 10 MG tablet Take 10 mg by mouth daily as needed for allergies.    [provider]  cholecalciferol (VITAMIN D3) 25 MCG (1000 UNIT) tablet Take 1,000 Units by mouth daily.    [provider]  clindamycin (CLEOCIN T) 1 % lotion Apply 1 application topically 2 (two) times daily as needed (for flare up of hair bumps  on neck and scalp).    [provider]  hydrochlorothiazide (HYDRODIURIL) 25 MG tablet Take 25 mg by mouth daily.    [provider]  naproxen sodium (ALEVE) 220 MG tablet Take 440 mg by mouth 2 (two) times daily as needed (pain).     [provider]  omeprazole (PRILOSEC OTC) 20 MG tablet Take 20 mg by mouth daily.    [provider]  sildenafil (VIAGRA) 100 MG tablet Take 100 mg by mouth daily as needed for erectile dysfunction.    [provider]  traMADol (ULTRAM) 50 MG tablet Take 50 mg by mouth every 4 (four) hours as needed for moderate pain.     [provider]      Allergies    Patient has no known allergies.    Review of Systems   Review of Systems  Constitutional:  Positive for activity change.  Genitourinary:  Positive for dysuria.   Physical Exam Updated Vital Signs BP 135/80    Pulse 79    Temp 97.7 F (36.5 C) (Oral)    Resp 20    Ht 6\' 5"  (1.956 m)    Wt 94.3 kg    SpO2 97%    BMI 24.67 kg/m  Physical Exam Vitals and nursing note reviewed.  Constitutional:      Appearance: He  is well-developed.  HENT:     Head: Atraumatic.  Cardiovascular:     Rate and Rhythm: Normal rate.  Pulmonary:     Effort: Pulmonary effort is normal.  Musculoskeletal:     Cervical back: Neck supple.  Skin:    General: Skin is warm.  Neurological:     Mental Status: He is alert and oriented to person, place, and time.    ED Results / Procedures / Treatments   Labs (all labs ordered are listed, but only abnormal results are displayed) Labs Reviewed  URINALYSIS, ROUTINE W REFLEX MICROSCOPIC - Abnormal; Notable for the following components:      Result Value   Ketones, ur 5 (*)    Leukocytes,Ua TRACE (*)    All other components within normal limits  BASIC METABOLIC PANEL - Abnormal; Notable for the following components:   Glucose, Bld 144 (*)    All other components within normal limits  URINE CULTURE  CBC WITH  DIFFERENTIAL/PLATELET  PROTIME-INR    EKG None  Radiology No results found.  Procedures Procedures    Medications Ordered in ED Medications  cefTRIAXone (ROCEPHIN) injection 500 mg (500 mg Intramuscular Given 11/10/21 1508)  phenazopyridine (PYRIDIUM) tablet 200 mg (200 mg Oral Given 11/10/21 1508)  sterile water (preservative free) injection (1 mL  Given 11/10/21 1509)    ED Course/ Medical Decision Making/ A&P                           Medical Decision Making 71 year old male comes in with chief complaint of blood in the urine, urinary frequency and burning with urination.  He is also having some urge incontinence like symptoms.  Differential diagnosis includes UTI, kidney stone, prostatitis, STI.  He is immunocompetent.  Does have a history of enlarged prostate but has no rectal pain, fevers or chills that would have [place] concern for prostatitis.  No high risk behavior or history of STI, no history of kidney stone and currently pain-free.  Suspect that patient likely has UTI.  We will start him on Keflex. Advised that he follows up with the PCP.  Amount and/or Complexity of Data Reviewed Labs: ordered.    Details: Metabolic profile looks normal. UA is pending at this time, but patient would prefer leaving and clinically he has a UTI, so we dont need to hold him.  Risk OTC drugs. Prescription drug management.          Final Clinical Impression(s) / ED Diagnoses Final diagnoses:  Cystitis    Rx / DC Orders ED Discharge Orders          Ordered    cephALEXin (KEFLEX) 500 MG capsule  2 times daily        11/10/21 1518    phenazopyridine (PYRIDIUM) 200 MG tablet  3 times daily        11/10/21 1518    acetaminophen (TYLENOL) 500 MG tablet  Every 6 hours PRN        11/10/21 1518              Varney Biles, MD 11/10/21 1538

## 2021-11-12 LAB — URINE CULTURE: Culture: NO GROWTH
